# Patient Record
Sex: Female | Born: 1980 | Race: White | Hispanic: Yes | Marital: Married | Smoking: Never smoker
Health system: Southern US, Community
[De-identification: ages and names within clinical notes are randomized; demographics above are authoritative.]

## PROBLEM LIST (undated history)

## (undated) ENCOUNTER — Emergency Department (HOSPITAL_COMMUNITY): Admission: EM | Discharge status: Absent without leave | Payer: Self-pay

## (undated) ENCOUNTER — Inpatient Hospital Stay (HOSPITAL_COMMUNITY): Payer: Self-pay

## (undated) DIAGNOSIS — E119 Type 2 diabetes mellitus without complications: Secondary | ICD-10-CM

## (undated) DIAGNOSIS — Z789 Other specified health status: Secondary | ICD-10-CM

## (undated) HISTORY — PX: APPENDECTOMY: SHX54

---
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
## 2001-02-23 ENCOUNTER — Inpatient Hospital Stay (HOSPITAL_COMMUNITY): Admission: AD | Admit: 2001-02-23 | Discharge: 2001-02-23 | Payer: Self-pay | Admitting: Obstetrics

## 2001-09-11 ENCOUNTER — Inpatient Hospital Stay (HOSPITAL_COMMUNITY): Admission: AD | Admit: 2001-09-11 | Discharge: 2001-09-14 | Payer: Self-pay | Admitting: Obstetrics & Gynecology

## 2001-11-18 ENCOUNTER — Encounter: Admission: RE | Admit: 2001-11-18 | Discharge: 2001-11-18 | Payer: Self-pay | Admitting: Obstetrics & Gynecology

## 2001-12-04 ENCOUNTER — Encounter: Admission: RE | Admit: 2001-12-04 | Discharge: 2001-12-04 | Payer: Self-pay | Admitting: Obstetrics

## 2002-12-08 ENCOUNTER — Encounter: Admission: RE | Admit: 2002-12-08 | Discharge: 2002-12-08 | Payer: Self-pay | Admitting: Family Medicine

## 2003-01-18 ENCOUNTER — Ambulatory Visit (HOSPITAL_COMMUNITY): Admission: RE | Admit: 2003-01-18 | Discharge: 2003-01-18 | Payer: Self-pay | Admitting: *Deleted

## 2003-02-24 ENCOUNTER — Encounter: Admission: RE | Admit: 2003-02-24 | Discharge: 2003-02-24 | Payer: Self-pay | Admitting: *Deleted

## 2003-02-25 ENCOUNTER — Ambulatory Visit (HOSPITAL_COMMUNITY): Admission: RE | Admit: 2003-02-25 | Discharge: 2003-02-25 | Payer: Self-pay | Admitting: *Deleted

## 2003-03-10 ENCOUNTER — Encounter: Admission: RE | Admit: 2003-03-10 | Discharge: 2003-03-10 | Payer: Self-pay | Admitting: *Deleted

## 2003-03-31 ENCOUNTER — Encounter: Admission: RE | Admit: 2003-03-31 | Discharge: 2003-03-31 | Payer: Self-pay | Admitting: *Deleted

## 2003-04-14 ENCOUNTER — Encounter: Admission: RE | Admit: 2003-04-14 | Discharge: 2003-04-14 | Payer: Self-pay | Admitting: *Deleted

## 2003-05-17 ENCOUNTER — Inpatient Hospital Stay (HOSPITAL_COMMUNITY): Admission: AD | Admit: 2003-05-17 | Discharge: 2003-05-17 | Payer: Self-pay | Admitting: Family Medicine

## 2003-05-20 ENCOUNTER — Encounter: Admission: RE | Admit: 2003-05-20 | Discharge: 2003-05-20 | Payer: Self-pay | Admitting: *Deleted

## 2003-05-26 ENCOUNTER — Ambulatory Visit (HOSPITAL_COMMUNITY): Admission: RE | Admit: 2003-05-26 | Discharge: 2003-05-26 | Payer: Self-pay | Admitting: *Deleted

## 2003-06-18 ENCOUNTER — Inpatient Hospital Stay (HOSPITAL_COMMUNITY): Admission: AD | Admit: 2003-06-18 | Discharge: 2003-06-18 | Payer: Self-pay | Admitting: Obstetrics & Gynecology

## 2003-07-07 ENCOUNTER — Encounter: Admission: RE | Admit: 2003-07-07 | Discharge: 2003-07-07 | Payer: Self-pay | Admitting: *Deleted

## 2003-07-07 ENCOUNTER — Inpatient Hospital Stay (HOSPITAL_COMMUNITY): Admission: AD | Admit: 2003-07-07 | Discharge: 2003-07-09 | Payer: Self-pay | Admitting: Obstetrics and Gynecology

## 2006-09-15 ENCOUNTER — Emergency Department (HOSPITAL_COMMUNITY): Admission: EM | Admit: 2006-09-15 | Discharge: 2006-09-15 | Payer: Self-pay | Admitting: Emergency Medicine

## 2007-06-05 ENCOUNTER — Ambulatory Visit: Payer: Self-pay | Admitting: Family Medicine

## 2007-07-17 ENCOUNTER — Ambulatory Visit (HOSPITAL_BASED_OUTPATIENT_CLINIC_OR_DEPARTMENT_OTHER): Admission: RE | Admit: 2007-07-17 | Discharge: 2007-07-17 | Payer: Self-pay | Admitting: Urology

## 2009-08-05 ENCOUNTER — Emergency Department (HOSPITAL_COMMUNITY): Admission: EM | Admit: 2009-08-05 | Discharge: 2009-08-05 | Payer: Self-pay | Admitting: Emergency Medicine

## 2010-03-02 ENCOUNTER — Inpatient Hospital Stay (HOSPITAL_COMMUNITY): Admission: AD | Admit: 2010-03-02 | Discharge: 2010-03-02 | Payer: Self-pay | Admitting: Family Medicine

## 2010-05-03 ENCOUNTER — Emergency Department (HOSPITAL_COMMUNITY): Admission: EM | Admit: 2010-05-03 | Discharge: 2010-05-03 | Payer: Self-pay | Admitting: Emergency Medicine

## 2010-05-24 ENCOUNTER — Encounter: Payer: Self-pay | Admitting: Obstetrics and Gynecology

## 2010-05-24 ENCOUNTER — Ambulatory Visit: Payer: Self-pay | Admitting: Obstetrics and Gynecology

## 2010-05-24 LAB — CONVERTED CEMR LAB
Antibody Screen: NEGATIVE
Basophils Relative: 0 % (ref 0–1)
Eosinophils Absolute: 0.1 10*3/uL (ref 0.0–0.7)
Hemoglobin: 11.1 g/dL — ABNORMAL LOW (ref 12.0–15.0)
Hepatitis B Surface Ag: NEGATIVE
Hgb F Quant: 0 % (ref 0.0–2.0)
Lymphs Abs: 1.3 10*3/uL (ref 0.7–4.0)
MCHC: 31.9 g/dL (ref 30.0–36.0)
Monocytes Absolute: 0.3 10*3/uL (ref 0.1–1.0)
Neutrophils Relative %: 78 % — ABNORMAL HIGH (ref 43–77)
RBC: 4.11 M/uL (ref 3.87–5.11)
Rh Type: POSITIVE
Rubella: 387.5 intl units/mL — ABNORMAL HIGH

## 2010-06-01 ENCOUNTER — Ambulatory Visit (HOSPITAL_COMMUNITY): Admission: RE | Admit: 2010-06-01 | Discharge: 2010-06-01 | Payer: Self-pay | Admitting: Obstetrics and Gynecology

## 2010-06-26 ENCOUNTER — Ambulatory Visit: Payer: Self-pay | Admitting: Obstetrics & Gynecology

## 2010-06-27 ENCOUNTER — Encounter: Payer: Self-pay | Admitting: Obstetrics and Gynecology

## 2010-07-13 ENCOUNTER — Inpatient Hospital Stay (HOSPITAL_COMMUNITY): Admission: AD | Admit: 2010-07-13 | Discharge: 2010-07-13 | Payer: Self-pay | Admitting: Obstetrics and Gynecology

## 2010-07-17 ENCOUNTER — Ambulatory Visit: Payer: Self-pay | Admitting: Obstetrics and Gynecology

## 2010-07-17 ENCOUNTER — Encounter: Payer: Self-pay | Admitting: Obstetrics & Gynecology

## 2010-07-17 LAB — CONVERTED CEMR LAB
MCHC: 32.2 g/dL (ref 30.0–36.0)
Platelets: 223 10*3/uL (ref 150–400)
RBC: 4.22 M/uL (ref 3.87–5.11)
RDW: 14.4 % (ref 11.5–15.5)

## 2010-07-18 ENCOUNTER — Encounter: Payer: Self-pay | Admitting: Family Medicine

## 2010-08-10 ENCOUNTER — Encounter: Payer: Self-pay | Admitting: Obstetrics & Gynecology

## 2010-08-10 ENCOUNTER — Ambulatory Visit: Payer: Self-pay | Admitting: Obstetrics & Gynecology

## 2010-08-31 ENCOUNTER — Ambulatory Visit: Payer: Self-pay | Admitting: Obstetrics & Gynecology

## 2010-09-07 ENCOUNTER — Ambulatory Visit: Payer: Self-pay | Admitting: Obstetrics & Gynecology

## 2010-09-07 LAB — CONVERTED CEMR LAB: GC Probe Amp, Urine: NEGATIVE

## 2010-09-20 ENCOUNTER — Ambulatory Visit: Payer: Self-pay | Admitting: Obstetrics and Gynecology

## 2010-09-20 ENCOUNTER — Inpatient Hospital Stay (HOSPITAL_COMMUNITY): Admission: AD | Admit: 2010-09-20 | Discharge: 2010-09-20 | Payer: Self-pay | Admitting: Obstetrics & Gynecology

## 2010-09-21 ENCOUNTER — Ambulatory Visit: Payer: Self-pay | Admitting: Obstetrics & Gynecology

## 2010-09-21 ENCOUNTER — Inpatient Hospital Stay: Admission: AD | Admit: 2010-09-21 | Discharge: 2010-09-21 | Payer: Self-pay | Admitting: Obstetrics and Gynecology

## 2010-09-22 ENCOUNTER — Inpatient Hospital Stay (HOSPITAL_COMMUNITY)
Admission: AD | Admit: 2010-09-22 | Discharge: 2010-09-22 | Payer: Self-pay | Source: Home / Self Care | Admitting: Obstetrics and Gynecology

## 2010-09-23 ENCOUNTER — Inpatient Hospital Stay (HOSPITAL_COMMUNITY): Admission: AD | Admit: 2010-09-23 | Discharge: 2010-09-25 | Payer: Self-pay | Admitting: Obstetrics and Gynecology

## 2010-09-23 ENCOUNTER — Ambulatory Visit: Payer: Self-pay | Admitting: Family Medicine

## 2011-02-14 LAB — POCT URINALYSIS DIPSTICK
Bilirubin Urine: NEGATIVE
Bilirubin Urine: NEGATIVE
Glucose, UA: 100 mg/dL — AB
Hgb urine dipstick: NEGATIVE
Hgb urine dipstick: NEGATIVE
Ketones, ur: NEGATIVE mg/dL
Ketones, ur: NEGATIVE mg/dL
Nitrite: NEGATIVE
Nitrite: NEGATIVE
Protein, ur: NEGATIVE mg/dL
Specific Gravity, Urine: 1.02 (ref 1.005–1.030)
Urobilinogen, UA: 0.2 mg/dL (ref 0.0–1.0)

## 2011-02-14 LAB — CBC
HCT: 37.2 % (ref 36.0–46.0)
MCH: 27.6 pg (ref 26.0–34.0)
Platelets: 177 10*3/uL (ref 150–400)
RBC: 4.52 MIL/uL (ref 3.87–5.11)

## 2011-02-14 LAB — RPR: RPR Ser Ql: NONREACTIVE

## 2011-02-15 LAB — POCT URINALYSIS DIPSTICK
Bilirubin Urine: NEGATIVE
Bilirubin Urine: NEGATIVE
Glucose, UA: NEGATIVE mg/dL
Ketones, ur: NEGATIVE mg/dL
Ketones, ur: NEGATIVE mg/dL
Nitrite: NEGATIVE
Nitrite: NEGATIVE
Nitrite: NEGATIVE
Protein, ur: NEGATIVE mg/dL
Protein, ur: NEGATIVE mg/dL
Protein, ur: 30 mg/dL — AB
Specific Gravity, Urine: >=1.03 (ref 1.005–1.030)
Urobilinogen, UA: 0.2 mg/dL (ref 0.0–1.0)
pH: 5.5 (ref 5.0–8.0)

## 2011-02-16 LAB — URINALYSIS, ROUTINE W REFLEX MICROSCOPIC
Bilirubin Urine: NEGATIVE
Protein, ur: NEGATIVE mg/dL
Specific Gravity, Urine: 1.02 (ref 1.005–1.030)
Urobilinogen, UA: 0.2 mg/dL (ref 0.0–1.0)
pH: 7 (ref 5.0–8.0)

## 2011-02-16 LAB — WET PREP, GENITAL
Clue Cells Wet Prep HPF POC: NONE SEEN
Trich, Wet Prep: NONE SEEN
Yeast Wet Prep HPF POC: NONE SEEN

## 2011-02-17 LAB — POCT URINALYSIS DIP (DEVICE)
Bilirubin Urine: NEGATIVE
Hgb urine dipstick: NEGATIVE
Ketones, ur: NEGATIVE mg/dL
pH: 5.5 (ref 5.0–8.0)

## 2011-02-18 LAB — POCT URINALYSIS DIP (DEVICE)
Hgb urine dipstick: NEGATIVE
Ketones, ur: NEGATIVE mg/dL
Protein, ur: NEGATIVE mg/dL
Specific Gravity, Urine: 1.02 (ref 1.005–1.030)
Urobilinogen, UA: 0.2 mg/dL (ref 0.0–1.0)

## 2011-02-19 LAB — URINALYSIS, ROUTINE W REFLEX MICROSCOPIC
Bilirubin Urine: NEGATIVE
Hgb urine dipstick: NEGATIVE
Ketones, ur: >80 mg/dL — AB
pH: 8.5 — ABNORMAL HIGH (ref 5.0–8.0)

## 2011-02-19 LAB — DIFFERENTIAL
Basophils Absolute: 0 10*3/uL (ref 0.0–0.1)
Basophils Relative: 0 % (ref 0–1)
Eosinophils Relative: 0 % (ref 0–5)
Lymphocytes Relative: 8 % — ABNORMAL LOW (ref 12–46)
Lymphs Abs: 0.8 10*3/uL (ref 0.7–4.0)
Monocytes Relative: 6 % (ref 3–12)

## 2011-02-19 LAB — BASIC METABOLIC PANEL
BUN: 5 mg/dL — ABNORMAL LOW (ref 6–23)
Calcium: 8.6 mg/dL (ref 8.4–10.5)
Creatinine, Ser: 0.41 mg/dL (ref 0.4–1.2)
Potassium: 3.4 mEq/L — ABNORMAL LOW (ref 3.5–5.1)
Sodium: 134 mEq/L — ABNORMAL LOW (ref 135–145)

## 2011-02-19 LAB — CBC
HCT: 33.3 % — ABNORMAL LOW (ref 36.0–46.0)
Hemoglobin: 11.3 g/dL — ABNORMAL LOW (ref 12.0–15.0)
Platelets: 175 10*3/uL (ref 150–400)
WBC: 10.2 10*3/uL (ref 4.0–10.5)

## 2011-02-19 LAB — POCT CARDIAC MARKERS: CKMB, poc: <1 ng/mL — ABNORMAL LOW (ref 1.0–8.0)

## 2011-02-25 LAB — URINALYSIS, ROUTINE W REFLEX MICROSCOPIC
Ketones, ur: NEGATIVE mg/dL
Nitrite: NEGATIVE
Protein, ur: NEGATIVE mg/dL
pH: 7 (ref 5.0–8.0)

## 2011-02-25 LAB — URINE CULTURE

## 2011-02-25 LAB — CBC
HCT: 34.5 % — ABNORMAL LOW (ref 36.0–46.0)
MCV: 84.8 fL (ref 78.0–100.0)
Platelets: 190 10*3/uL (ref 150–400)
RDW: 13.4 % (ref 11.5–15.5)

## 2011-02-25 LAB — COMPREHENSIVE METABOLIC PANEL
Albumin: 3.5 g/dL (ref 3.5–5.2)
BUN: 8 mg/dL (ref 6–23)
Creatinine, Ser: 0.48 mg/dL (ref 0.4–1.2)
Total Bilirubin: 0.4 mg/dL (ref 0.3–1.2)
Total Protein: 6.3 g/dL (ref 6.0–8.3)

## 2011-02-25 LAB — URINE MICROSCOPIC-ADD ON

## 2011-02-25 LAB — HCG, QUANTITATIVE, PREGNANCY: hCG, Beta Chain, Quant, S: 89672 m[IU]/mL — ABNORMAL HIGH (ref <5)

## 2011-02-25 LAB — POCT PREGNANCY, URINE: Preg Test, Ur: POSITIVE

## 2011-02-25 LAB — WET PREP, GENITAL: Yeast Wet Prep HPF POC: NONE SEEN

## 2011-02-25 LAB — GC/CHLAMYDIA PROBE AMP, GENITAL
Chlamydia, DNA Probe: NEGATIVE
GC Probe Amp, Genital: NEGATIVE

## 2011-04-17 NOTE — Group Therapy Note (Signed)
NAME:  Andrea Wise, FLIGHT NO.:  [id number]   MEDICAL RECORD NO.:  [id number]          PATIENT TYPE:  WOC   LOCATION:  [facility]                   FACILITY:  [facility]   PHYSICIAN:  Tinnie Gens, MD        DATE OF BIRTH:  07/29/1980   DATE OF SERVICE:  06/05/2007                                  CLINIC NOTE   CHIEF COMPLAINT:  Dyspareunia.   HISTORY OF PRESENT ILLNESS:  The patient is a 30 year old, gravida 5,  para 2-0-1-2, who has two previous vaginal deliveries, the last delivery  date was July 07, 2003, who does not use anything for birth control  except natural family planning.  The [facility] for a progressive dyspareunia over the last approximately one  year.  She states that her vagina hurts and it is very painful.  She  first had intercourse at age 32, she has no history of sexually  transmitted diseases at all.  She has no history of sexual abuse of any  sort, she was never raped.  The patient was seen at the [facility] in May of this year and continued to have painful  intercourse, was treated with doxycycline prophylactically but had  negative GC and Chlamydia cultures, and because she had no response to  the doxycycline she was sent here for further workup and evaluation.   PAST MEDICAL HISTORY:  Negative.   PAST SURGICAL HISTORY:  Negative.   MEDICATIONS:  None.   ALLERGIES:  None known.   OBSTETRIC HISTORY:  She is G3, P2, two vaginal deliveries, one  spontaneous AB.   GYNECOLOGIC HISTORY:  Menarche at age 52, cycles every 28 days, no pain  with her periods and not heavy.  No history of abnormal Pap, the last  Pap was Apr 16, 2007, and was normal.   FAMILY HISTORY:  Diabetes.   SOCIAL HISTORY:  The patient does work, she denies tobacco, alcohol, or  drug use.   REVIEW OF SYSTEMS:  A 14-point review of systems was reviewed and is  negative except as described in the HPI.   PHYSICAL EXAMINATION:   VITAL SIGNS:  Her blood pressure is 100/60,  weight 123, temp 98, pulse 72.  GENERAL:  She is a well-developed, well-nourished, Hispanic female in no  acute distress.  ABDOMEN:  Soft, nontender, and nondistended.  GU:  Normal external female genitalia.  The vagina is pink with  rugation.  BUS is normal.  The cervix is parous without lesion.  The  uterus is small and anteverted, but the pelvis is diffusely tender  including the vagina circumferentially at the introitus.  The bladder is  exquisitely tender.  She has cervical motion tenderness.  The vaginal  walls on the side and posteriorly are all exquisitely tender.  The  patient was very tense during the exam and had a lot of difficulty with  relaxation.   IMPRESSION:  Dyspareunia, unclear etiology, doubt endometriosis given no  cyclical pelvic pain.   PLAN:  1. Rule out interstitial cystitis, referral to Urology.  2. Start Elavil 25 mg p.o. 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  3. We will have her back in four to six weeks to see how the      medication is doing as well as to follow up on the Urology referral      and to figure out what further workup or what else needs to be      done.           ______________________________  Tinnie Gens, MD     TP/MEDQ  D:  06/05/2007  T:  06/06/2007  Job:  [id number]

## 2011-04-17 NOTE — Op Note (Signed)
NAME:  Andrea Wise, Andrea Wise    ACCOUNT NO.:  [id number]   MEDICAL RECORD NO.:  [id number]          PATIENT TYPE:  AMB   LOCATION:  [facility]                         FACILITY:  [facility]   PHYSICIAN:  Martina Sinner, MD DATE OF BIRTH:  07/29/1980   DATE OF PROCEDURE:  DATE OF DISCHARGE:                               OPERATIVE REPORT   INDICATIONS:  Ms. Andrea Wise has pelvic pain and a lot of vaginal pain.  She has mild urinary frequency.   PROCEDURE:  Today she was prepped and draped in the usual fashion.  A 21-  [place] scope was used for examination. On initial inspection of the  bladder the bladder mucosa and trigone were normal.  She was  hydrodistended to 650 mL.  On reinspection of the bladder she had  diffuse glomerulations with bleeding.  She did not have any ulcers.   The bladder was emptied.  Then I instilled 15 mL of 0.5% Marcaine with  400 milligrams of Pyridium  through a small red rubber catheter.  In my  opinion, this patient has interstitial cystitis.  She will be treated as  per protocol.  Cost will be taken into consideration when treating her.           ______________________________  Martina Sinner, MD  Electronically Signed     SAM/MEDQ  D:  07/17/2007  T:  07/18/2007  Job:  [id number]

## 2011-08-12 IMAGING — US US OB COMP LESS 14 WK
1 series · 14 of 21 positions shown · non-contrast
Comparison: None.

CLINICAL DATA: Pelvic pain.  Pregnant with a quantitative beta HCG
of [DATE].  Unknown last menstrual period.

OBSTETRIC <14 WK US
TECHNIQUE: Transabdominal ultrasound was performed for complete
evaluation of the gestation as well as the maternal uterus, adnexal
regions, and pelvic cul-de-sac.

[Series 1: us ob comp less 14 wks · 0.24mm/px · 14 of 21 slices shown]
[im 1/21]
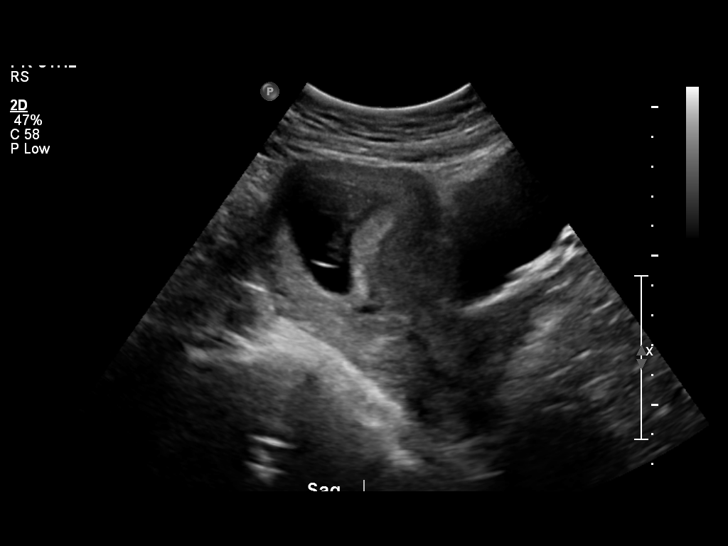
[im 3/21]
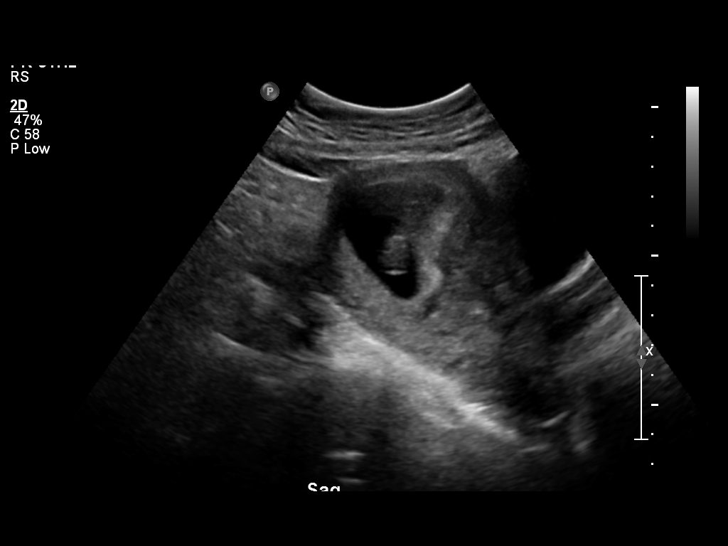
[im 4/21]
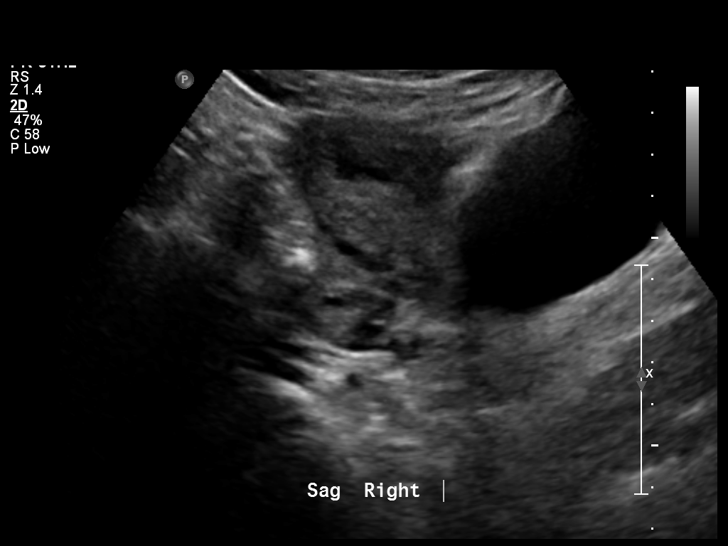
[im 6/21]
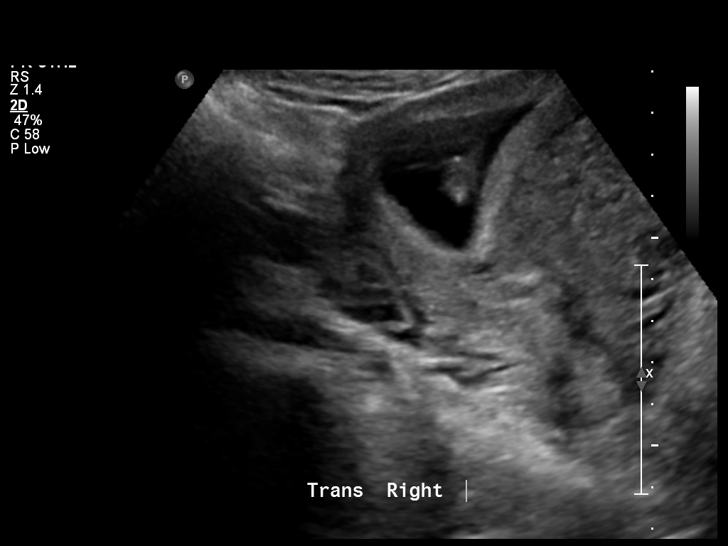
[im 7/21]
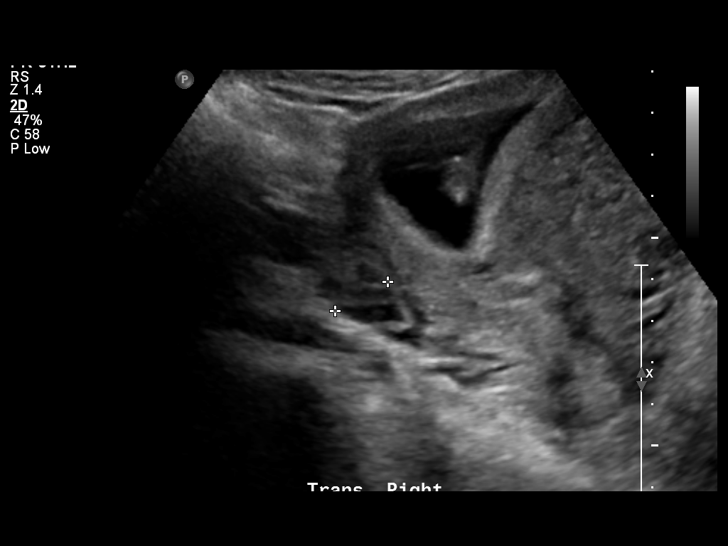
[im 9/21]
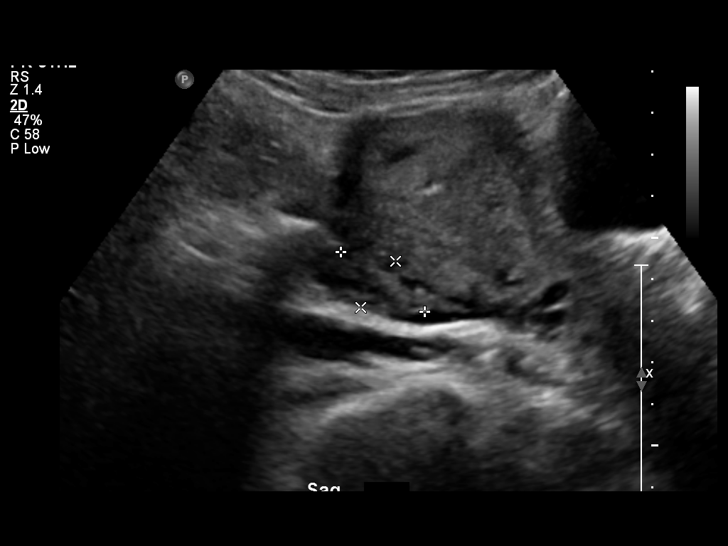
[im 10/21]
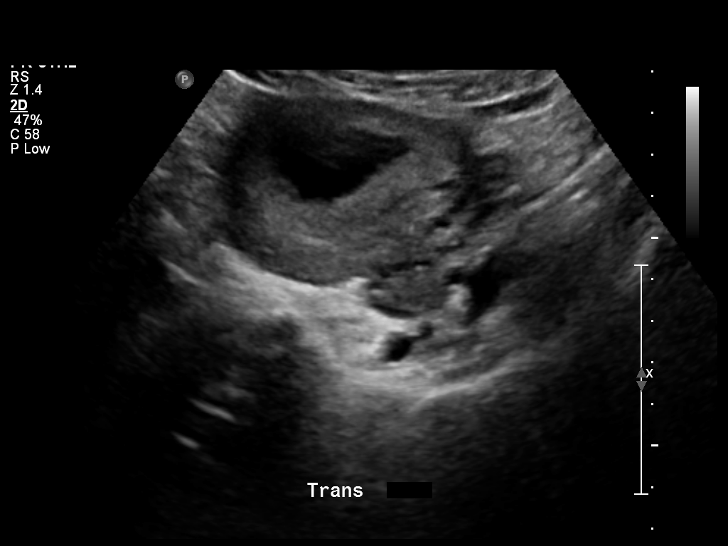
[im 12/21]
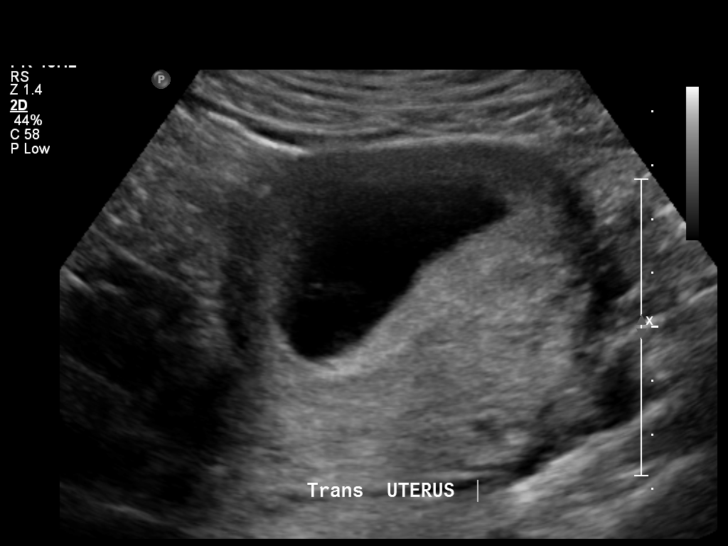
[im 13/21]
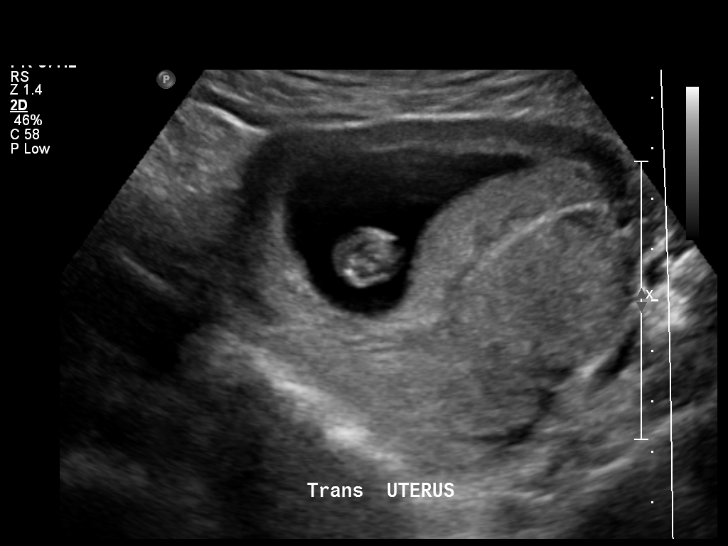
[im 15/21]
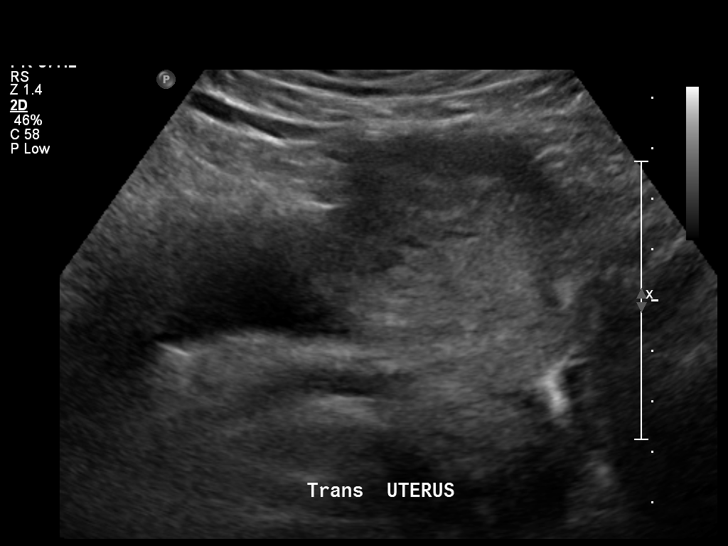
[im 16/21]
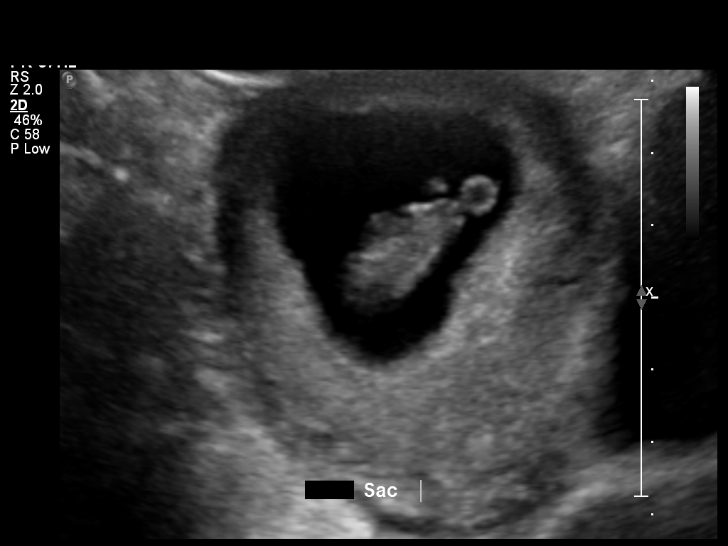
[im 18/21]
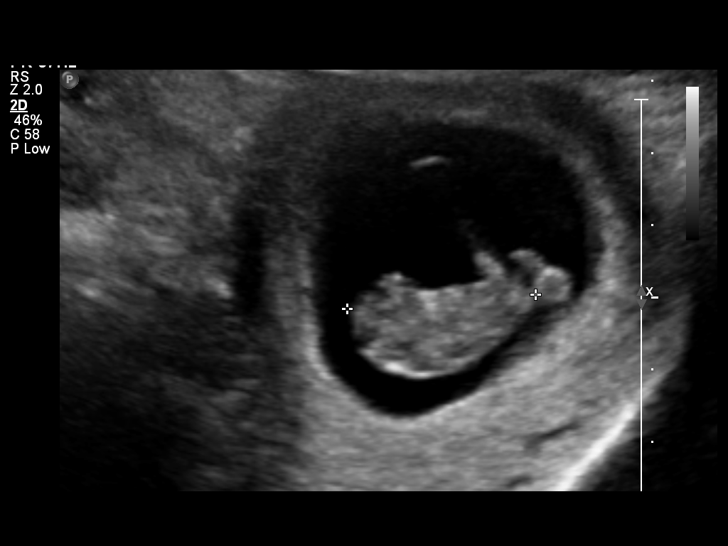
[im 19/21]
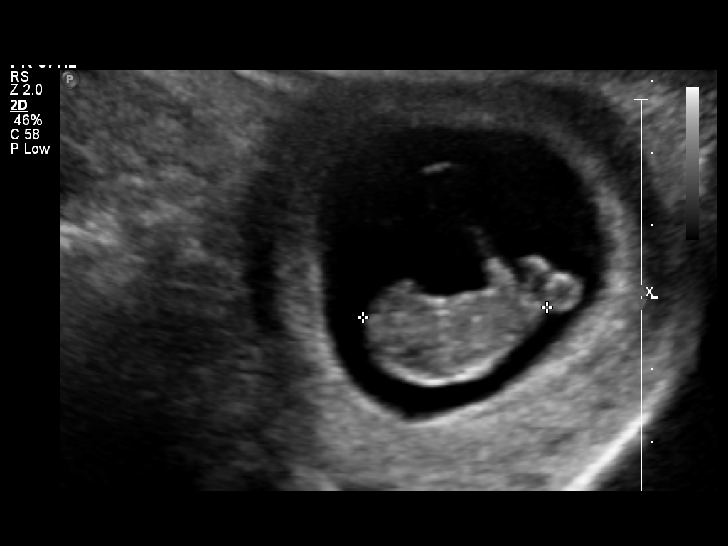
[im 21/21]
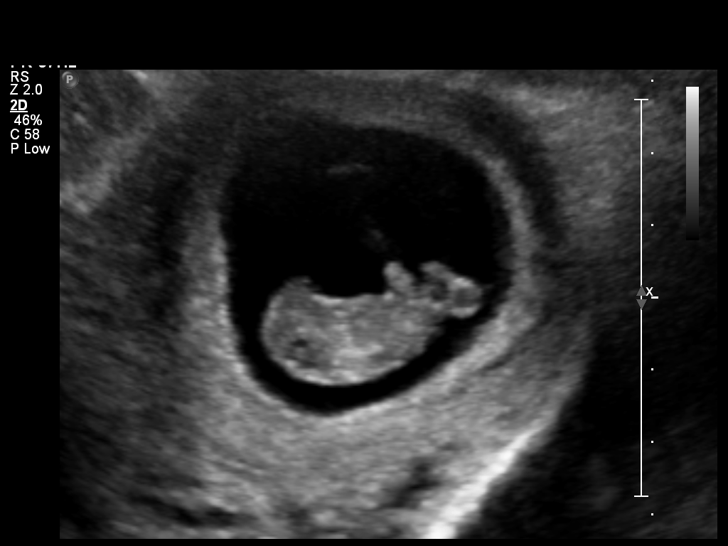

[14 of 21 positions shown; findings below may reference images not displayed]

FINDINGS: Intrauterine gestational sac containing a fetal pole
with a mean fetal crown-rump length of 2.54 cm.  This gives an
estimated gestational age of 9 weeks and 3 days.  This gives an
estimated date of delivery of 10/02/2010.  Normal fetal cardiac
activity with a fetal heart rate of 183 beats per minute.  An
adjacent normal appearing yolk sac is noted.  No subchorionic
hemorrhage seen.  Normal appearing maternal ovaries.  No free
peritoneal fluid.
IMPRESSION: Single live intrauterine gestation with an estimated gestational
age of 9 weeks and 3 days.

## 2011-09-17 LAB — POCT HEMOGLOBIN-HEMACUE: Hemoglobin: 13.9

## 2011-10-13 IMAGING — CR DG CHEST 2V
2 series · 2 of 2 positions shown · non-contrast
Comparison: None.

CLINICAL DATA: Chest pain.  Shortness of breath.

CHEST - 2 VIEW

[w chest pa]
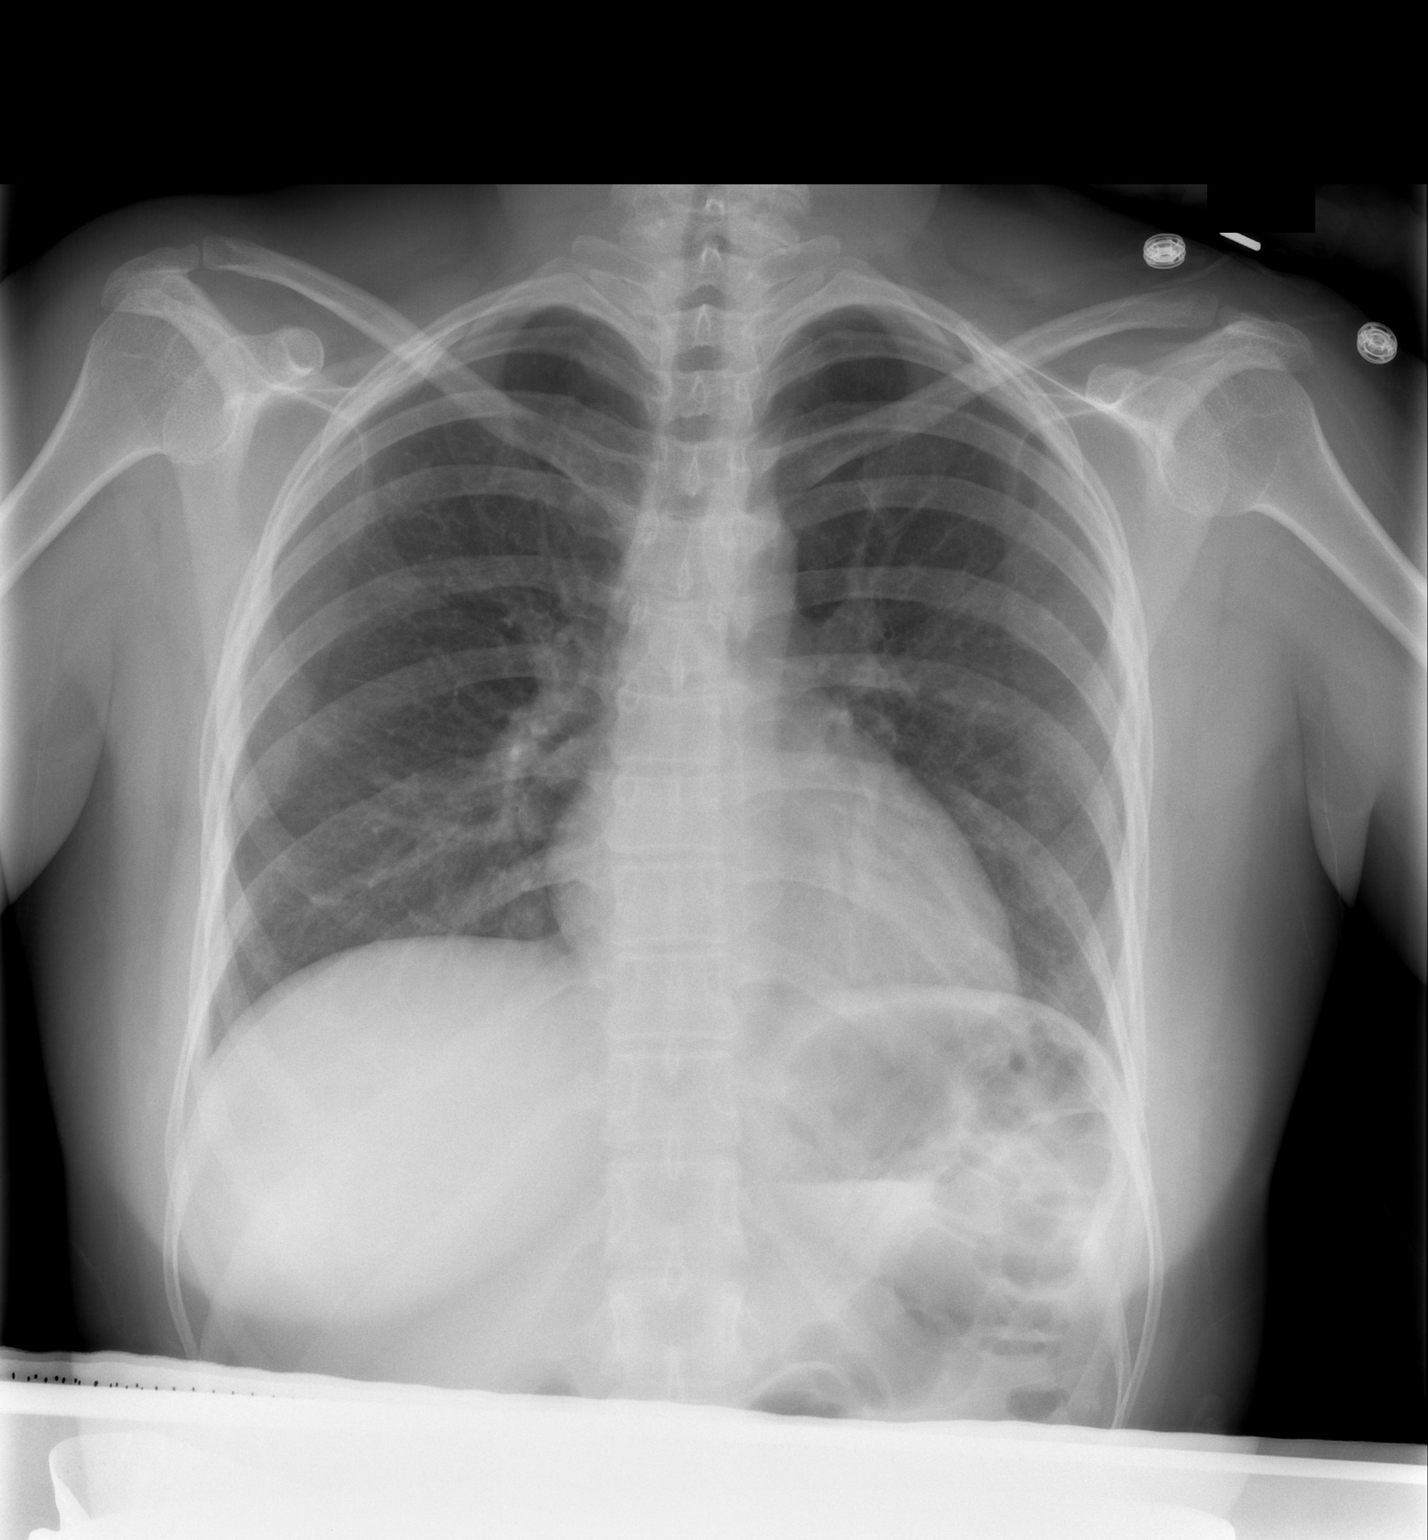

[w chest lat]
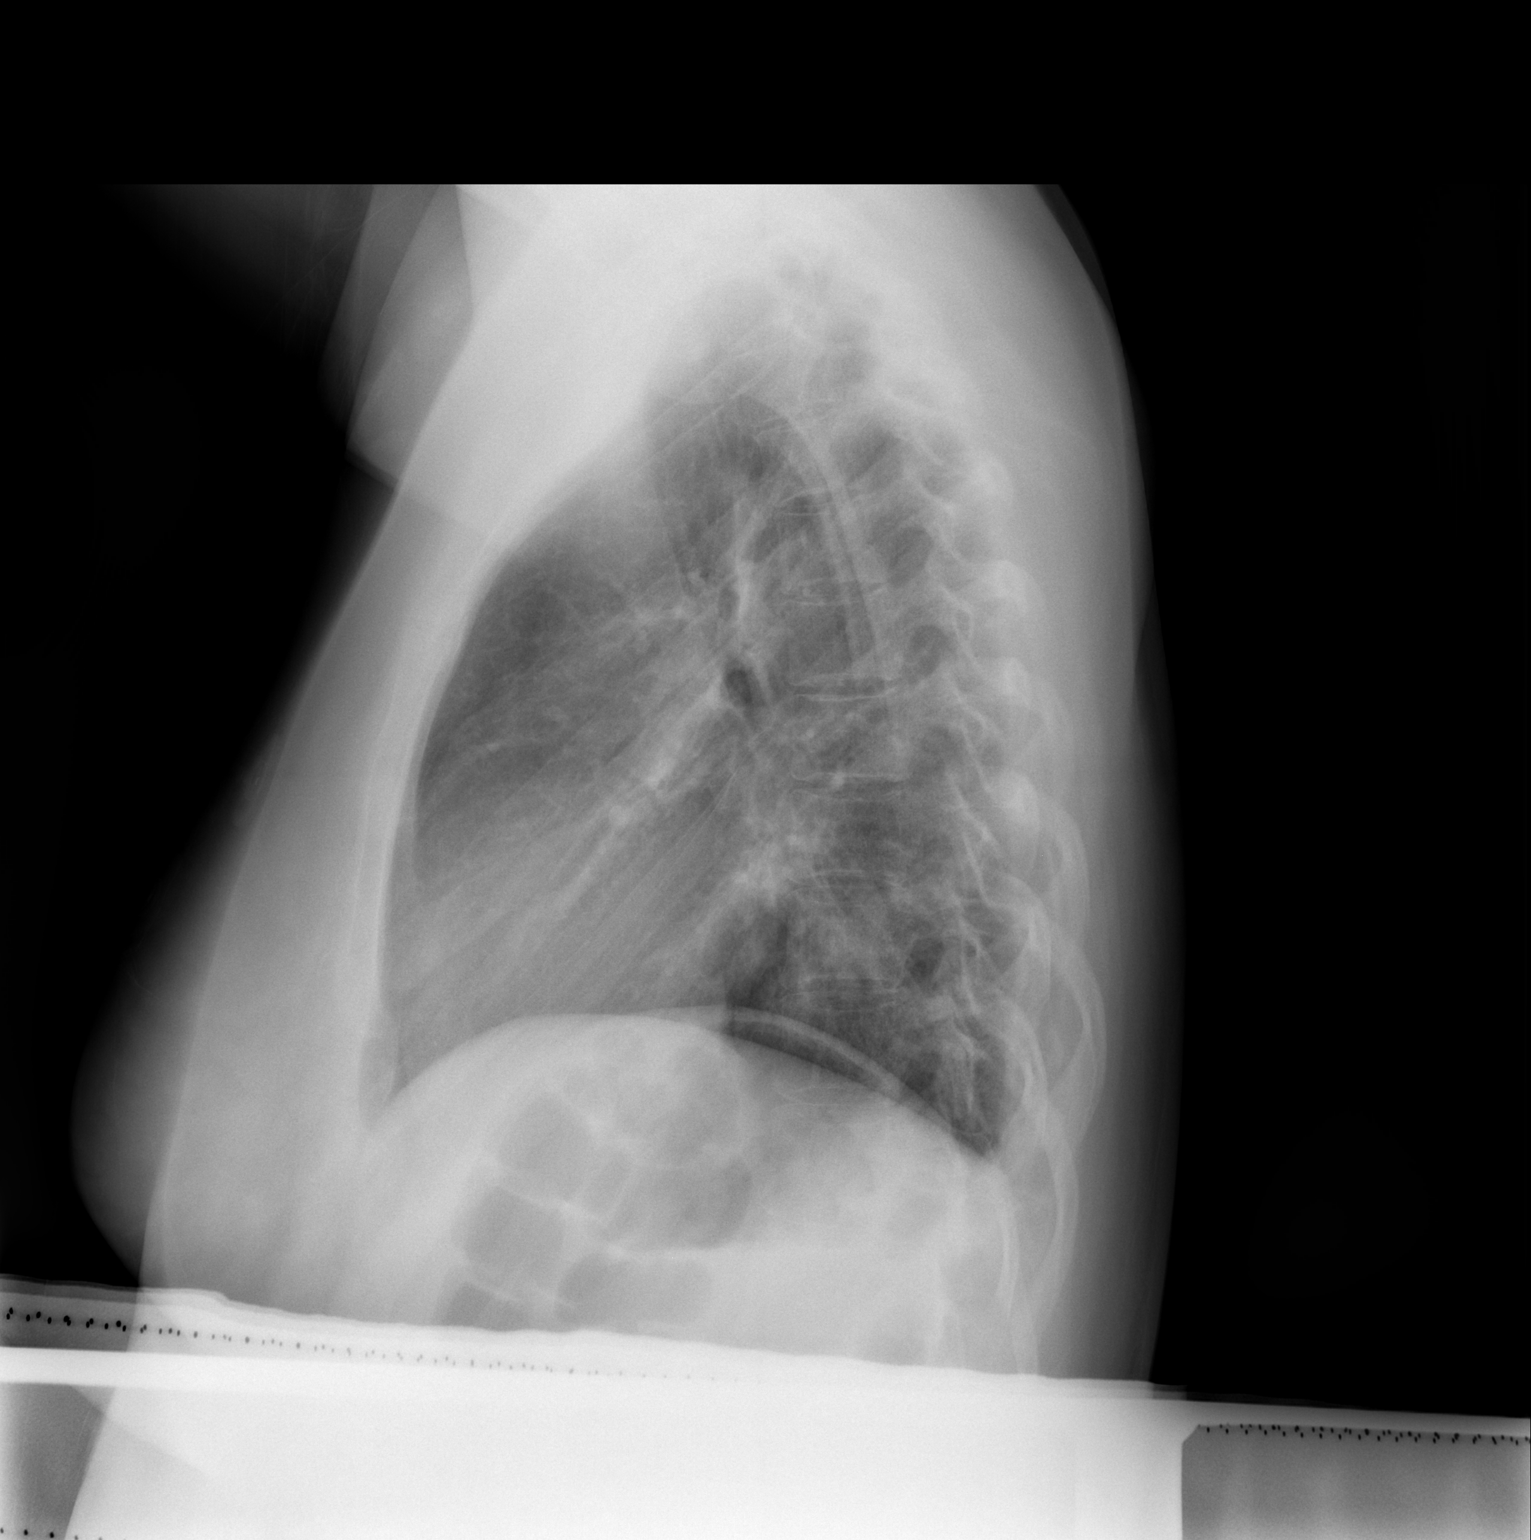

[2 of 2 positions shown; findings below may reference images not displayed]

FINDINGS: Lungs are clear.  Heart size is normal.  No pleural
effusion or focal bony abnormality.
IMPRESSION: Negative chest.

## 2011-12-04 NOTE — L&D Delivery Note (Signed)
I have seen and examined this patient and I agree with the above. Copper Kirtley 10:14 PM 08/19/2012

## 2011-12-04 NOTE — L&D Delivery Note (Signed)
Delivery Note At 3:57 AM a viable and healthy female was delivered via Vaginal, Spontaneous Delivery (Presentation: ;  ).  APGAR: 9, 9; weight 8 lb 1.5 oz (3670 g).   Placenta status: Intact, Spontaneous.  Cord: 3 vessels with the following complications: 1 loose nuchal cord, reduced during delivery .  Cord pH: not drawn  Anesthesia:  IV nubain Episiotomy: None Lacerations: small 1st degree perineal lac, not repaired (hemostatic) Suture Repair: n/a Est. Blood Loss (mL): (pitocin and 800mg  cytotec used)  Mom to postpartum.  Baby to nursery-stable.  Rudell Marlowe 08/16/2012, 4:21 AM

## 2012-04-15 ENCOUNTER — Inpatient Hospital Stay (HOSPITAL_COMMUNITY)
Admission: AD | Admit: 2012-04-15 | Discharge: 2012-04-15 | Disposition: A | Discharge status: Home / Self Care | Payer: Self-pay | Source: Ambulatory Visit | Attending: Obstetrics & Gynecology | Admitting: Obstetrics & Gynecology

## 2012-04-15 ENCOUNTER — Encounter (HOSPITAL_COMMUNITY): Payer: Self-pay | Admitting: *Deleted

## 2012-04-15 DIAGNOSIS — O99891 Other specified diseases and conditions complicating pregnancy: Secondary | ICD-10-CM | POA: Insufficient documentation

## 2012-04-15 DIAGNOSIS — N949 Unspecified condition associated with female genital organs and menstrual cycle: Secondary | ICD-10-CM

## 2012-04-15 DIAGNOSIS — R109 Unspecified abdominal pain: Secondary | ICD-10-CM | POA: Insufficient documentation

## 2012-04-15 LAB — URINE MICROSCOPIC-ADD ON

## 2012-04-15 LAB — URINALYSIS, ROUTINE W REFLEX MICROSCOPIC
Bilirubin Urine: NEGATIVE
Nitrite: NEGATIVE
Specific Gravity, Urine: >1.03 — ABNORMAL HIGH (ref 1.005–1.030)
pH: 5.5 (ref 5.0–8.0)

## 2012-04-15 LAB — WET PREP, GENITAL
Trich, Wet Prep: NONE SEEN
Yeast Wet Prep HPF POC: NONE SEEN

## 2012-04-15 LAB — POCT PREGNANCY, URINE: Preg Test, Ur: POSITIVE — AB

## 2012-04-15 NOTE — MAU Note (Signed)
Pt reports "contractions", unsure of LMP because she had a baby last year and has not had a period since then. Positive preg test at [facility].

## 2012-04-15 NOTE — MAU Provider Note (Signed)
Chief Complaint:  Abdominal Pain    First Provider Initiated Contact with Patient 04/15/12 2047      Andrea Wise is  31 y.o. [id number].  No LMP recorded. Patient is pregnant..  Her pregnancy status is positive.  She presents complaining of Abdominal Pain . Onset is described as intermittent and has been present for  1-2  days. Described intermittent sharp pain, worse with movement, resolved with rest. Lasts less than 10 seconds. Denies cramping, vaginal discharge, bleeding. Reports + FM.  Obstetrical/Gynecological History: OB History    Grav Para Term Preterm Abortions TAB SAB Ect Mult Living   5 3 2 1 1  1   3       Past Medical History: History reviewed. No pertinent past medical history.  Past Surgical History: Past Surgical History  Procedure Date  . Appendectomy     Family History: History reviewed. No pertinent family history.  Social History: History  Substance Use Topics  . Smoking status: Never Smoker   . Smokeless tobacco: Not on file  . Alcohol Use: No    Allergies: No Known Allergies  Prescriptions prior to admission  Medication Sig Dispense Refill  . Prenatal Vit-Fe Fumarate-FA (PRENATAL MULTIVITAMIN) TABS Take 1 tablet by mouth at bedtime.        Review of Systems - History obtained from the patient Respiratory ROS: no cough, shortness of breath, or wheezing Cardiovascular ROS: no chest pain or dyspnea on exertion Gastrointestinal ROS: positive for - abdominal pain negative for - constipation, diarrhea, gas/bloating or nausea/vomiting Genito-Urinary ROS: no dysuria, trouble voiding, or hematuria negative for - vulvar/vaginal symptoms  Physical Exam   Blood pressure 116/55, pulse 86, temperature 97 F (36.1 C), temperature source Oral, resp. rate 18, height 5' (1.524 m), weight 159 lb (72.122 kg), SpO2 98.00%.  General: General appearance - alert, well appearing, and in no distress and oriented to person, place, and time Mental  status - alert, oriented to person, place, and time, normal mood, behavior, speech, dress, motor activity, and thought processes, affect appropriate to mood Eyes - left eye normal, right eye normal Abdomen - gravid, non tender, fundus at umbilicus Extremities - peripheral pulses normal, no pedal edema, no clubbing or cyanosis Focused Gynecological Exam: normal external genitalia, vulva, vagina, cervix, uterus and adnexa, cervix: closed  Labs: Recent Results (from the past 24 hour(s))  URINALYSIS, ROUTINE W REFLEX MICROSCOPIC   Collection Time   04/15/12  8:20 PM      Component Value Range   Color, Urine YELLOW  YELLOW    APPearance CLEAR  CLEAR    Specific Gravity, Urine >1.030 (*) 1.005 - 1.030    pH 5.5  5.0 - 8.0    Glucose, UA NEGATIVE  NEGATIVE (mg/dL)   Hgb urine dipstick NEGATIVE  NEGATIVE    Bilirubin Urine NEGATIVE  NEGATIVE    Ketones, ur NEGATIVE  NEGATIVE (mg/dL)   Protein, ur NEGATIVE  NEGATIVE (mg/dL)   Urobilinogen, UA 0.2  0.0 - 1.0 (mg/dL)   Nitrite NEGATIVE  NEGATIVE    Leukocytes, UA SMALL (*) NEGATIVE   URINE MICROSCOPIC-ADD ON   Collection Time   04/15/12  8:20 PM      Component Value Range   Squamous Epithelial / LPF FEW (*) RARE    WBC, UA 7-10  <3 (WBC/hpf)   RBC / HPF 0-2  <3 (RBC/hpf)   Bacteria, UA FEW (*) RARE    Urine-Other RARE YEAST    POCT PREGNANCY, URINE  Collection Time   04/15/12  8:30 PM      Component Value Range   Preg Test, Ur POSITIVE (*) NEGATIVE   WET PREP, GENITAL   Collection Time   04/15/12  8:50 PM      Component Value Range   Yeast Wet Prep HPF POC NONE SEEN  NONE SEEN    Trich, Wet Prep NONE SEEN  NONE SEEN    Clue Cells Wet Prep HPF POC NONE SEEN  NONE SEEN    WBC, Wet Prep HPF POC MANY (*) NONE SEEN    Imaging Studies:  Informal bedside US by provider. HC c/w [redacted]w[redacted]d. Sub NL fluid. + FM and cardiac activity.    Assessment: Round ligament pain  Plan: Discharge home FU as scheduled with [facility] will call to  schedule OB Complete US for anatomy  Dennard Vezina E. 04/15/2012,9:44 PM

## 2012-04-15 NOTE — MAU Provider Note (Signed)
Attestation of Attending Supervision of Advanced Practitioner: Evaluation and management procedures were performed by the [facility] Fellow/PA/CNM/NP under my supervision and collaboration. Chart reviewed, and agree with management and plan.  Jaynie Collins, M.D. 04/15/2012 10:37 PM

## 2012-04-15 NOTE — Discharge Instructions (Signed)
Dolor del ligamento redondo (Round Ligament Pain) El ligamento redondo se compone de msculo y tejido fibroso. Est unido al tero cerca de las trompas de [place] El ligamento redondo est ubicado en ambos lados del tero y ayuda a mantener su posicin. Normalmente comienza en el segundo trimestre del embarazo cuando el tero sale hacia afuera de la pelvis. El dolor puede aparecer y desaparecer hasta el nacimiento del beb. El dolor de ligamento redondo no es un problema serio y no ocasiona daos al beb. CAUSAS Durante el embarazo el tero crece mayormente desde el segundo trimestre hasta el parto. A medida que crece, se estira y tuerce ligeramente los ligamentos. Cuando el tero ejerce presin en ambos lados, el ligamento redondo del lado opuesto presiona y se estira. Esto causa dolor. SNTOMAS El dolor puede ocurrir en uno o ambos lados. El dolor es por lo general como un pellizco corto y filoso. A veces puede ser un dolor opaco y persistente. Se siente en la parte baja del abdomen o en la ingle. Es un dolor interno, y por lo general comienza en la ingle y se mueve hacia la zona de la cadera. El dolor puede ocurrir con:  Cambio de posicin repentino como el levantarse de la cama o de una silla.   Darse vuelta en la cama.   Toser o estornudar.   Caminar demasiado.   Cualquier tipo de actividad fsica.  DIAGNSTICO El medico deber asegurarse de que no existen problemas graves que ocasionan el dolor. Si no encuentra nada grave, los sntomas suelen indicar de que se trata de un dolor proveniente del ligamento redondo. TRATAMIENTO  Sintese y reljese cuando el dolor comience.   Llevar las rodillas hacia el estmago.   Recustese sobre un lado con una almohada debajo del vientre (abdomen) y otra entre sus piernas.   Sintese en un bao caliente de 15 a 20 minutos o hasta que el dolor desaparezca.  INSTRUCCIONES PARA EL CUIDADO DOMICILIARIO  Utilice los medicamentos de venta libre o de  prescripcin para el dolor, el malestar o la fiebre, segn se lo indique el profesional que lo asiste.   Sintese y pngase de pie lentamente.   Evite caminatas largas si le ocasionan dolor.   Detenga o disminuya las actividades fsicas si le ocasionan dolor.  SOLICITE ATENCIN MDICA SI:  El dolor no desaparece con estas medidas.   Necesita que le prescriban medicamentos ms fuertes para el dolor.   Desarrolla un dolor de espalda que no haba sentido antes junto con el de lado.  SOLICITE ATENCIN MDICA DE INMEDIATO SI:  La temperatura se eleva por encima de 102 F (38.9 C) o ms.   Siente contracciones uterinas.   Presenta hemorragia vaginal.   Presenta nuseas, diarrea o vmitos.   Comienza a sentir escalofros   Siente dolor al orinar.  Document Released: 11/01/2008 Document Revised: 11/08/2011 [employer] Patient Information 2012 [employer]. 

## 2012-04-15 NOTE — MAU Note (Signed)
Patient is here with c/o lower abdominal pain that she describes as contractions that started today. She denies any vaginal bleeding, discharge. She states that she has not had a period since last pregnany, delivered oct22nd,2011. She states that sh ehad one period in 2012 (the baby was 8months). She states that she went to get implanon (patient can't remember the month), she was told that she was pregnant.

## 2012-04-16 LAB — GC/CHLAMYDIA PROBE AMP, GENITAL: GC Probe Amp, Genital: NEGATIVE

## 2012-04-21 ENCOUNTER — Ambulatory Visit (HOSPITAL_COMMUNITY)
Admission: RE | Admit: 2012-04-21 | Discharge: 2012-04-21 | Disposition: A | Discharge status: Home / Self Care | Payer: Self-pay | Source: Ambulatory Visit | Attending: Physician Assistant | Admitting: Physician Assistant

## 2012-04-21 DIAGNOSIS — O0930 Supervision of pregnancy with insufficient antenatal care, unspecified trimester: Secondary | ICD-10-CM | POA: Insufficient documentation

## 2012-04-21 DIAGNOSIS — O09299 Supervision of pregnancy with other poor reproductive or obstetric history, unspecified trimester: Secondary | ICD-10-CM | POA: Insufficient documentation

## 2012-04-21 DIAGNOSIS — Z8751 Personal history of pre-term labor: Secondary | ICD-10-CM | POA: Insufficient documentation

## 2012-04-21 DIAGNOSIS — Z3689 Encounter for other specified antenatal screening: Secondary | ICD-10-CM | POA: Insufficient documentation

## 2012-04-21 DIAGNOSIS — N949 Unspecified condition associated with female genital organs and menstrual cycle: Secondary | ICD-10-CM

## 2012-06-10 ENCOUNTER — Inpatient Hospital Stay (HOSPITAL_COMMUNITY)
Admission: AD | Admit: 2012-06-10 | Discharge: 2012-06-10 | Disposition: A | Discharge status: Home / Self Care | Payer: Self-pay | Source: Ambulatory Visit | Attending: Obstetrics & Gynecology | Admitting: Obstetrics & Gynecology

## 2012-06-10 ENCOUNTER — Inpatient Hospital Stay (HOSPITAL_COMMUNITY): Payer: Self-pay

## 2012-06-10 ENCOUNTER — Encounter (HOSPITAL_COMMUNITY): Payer: Self-pay

## 2012-06-10 DIAGNOSIS — O09219 Supervision of pregnancy with history of pre-term labor, unspecified trimester: Secondary | ICD-10-CM

## 2012-06-10 DIAGNOSIS — O4700 False labor before 37 completed weeks of gestation, unspecified trimester: Secondary | ICD-10-CM | POA: Insufficient documentation

## 2012-06-10 HISTORY — DX: Other specified health status: Z78.9

## 2012-06-10 LAB — OB RESULTS CONSOLE ANTIBODY SCREEN: Antibody Screen: NEGATIVE

## 2012-06-10 LAB — OB RESULTS CONSOLE RPR: RPR: NONREACTIVE

## 2012-06-10 NOTE — MAU Note (Signed)
Andrea Wise CNM in with patient to discuss (with interpreter) [place] and cervical length.

## 2012-06-10 NOTE — MAU Note (Signed)
Pt reports having cramping for one month and feels them at least once or twice  per day which is instant then goes away.

## 2012-06-10 NOTE — MAU Note (Signed)
Patient states she was seen today at [facility] for a regular appointment and was told her cervix was thin and sent to MAU for evaluation. Patient states occasional contractions but none now. Denies any bleeding or leaking but does have a mucus discharge. Reports good fetal movement.

## 2012-06-10 NOTE — MAU Provider Note (Signed)
Chief Complaint:  Labor Eval   First Provider Initiated Contact with Patient 06/10/12 2018      HPI  Andrea Wise is a 31 y.o. [id number] at [redacted]w[redacted]d presents to MAU reporting being told that her cervix was short [facility]. She reports rare mild UC's and denies leakage of fluid or vaginal bleeding. Good fetal movement. Hx of PTD due to PTL at 7 months with her first child. No 17-P this pregnancy.    Past Medical History: Past Medical History  Diagnosis Date  . No pertinent past medical history     Past Surgical History: Past Surgical History  Procedure Date  . Appendectomy     Family History: Family History  Problem Relation Age of Onset  . Other Neg Hx     Social History: History  Substance Use Topics  . Smoking status: Never Smoker   . Smokeless tobacco: Not on file  . Alcohol Use: No    Allergies: No Known Allergies  Meds:  Prescriptions prior to admission  Medication Sig Dispense Refill  . Prenatal Vit-Fe Fumarate-FA (PRENATAL MULTIVITAMIN) TABS Take 1 tablet by mouth at bedtime.          Physical Exam  Blood pressure 119/62, pulse 89, temperature 98.3 F (36.8 C), temperature source Oral, resp. rate 16, height 5' 0.5" (1.537 m), weight 74.571 kg (164 lb 6.4 oz), SpO2 99.00%. GENERAL: Well-developed, well-nourished female in no acute distress.  HEENT: normocephalic, good dentition HEART: normal rate RESP: normal effort ABDOMEN: Soft, nontender, gravid appropriate for gestational age EXTREMITIES: Nontender, no edema NEURO: alert and oriented  SPECULUM EXAM: Deferred. No FFN done to cervical exam >24 hours ago  Dilation: Closed internal os, FT external os Effacement (%): Thick Cervical Position: Posterior Station: Ballotable Exam by:: Renee Rival CNM  FHT:  Baseline 130 , moderate variability, accelerations present, no decelerations Contractions: rare, mild   Labs: NA  Imaging:    Assessment: 1.  History of preterm delivery, currently pregnant. No evidence of preterm labor at this visit.    Plan: D/C home Preterm labor precautions Follow-up Information    Schedule an appointment as soon as possible for a visit with [facility] [place].   Contact information:   [address] [facility] [address]       Follow up with [facility]. (As needed if symptoms worsen)    Contact information:   [address] [place] [address] [phone]        Medication List  As of 06/12/2012 12:58 AM   CONTINUE taking these medications         prenatal multivitamin Tabs           Jacorey Donaway 7/9/20139:00 PM

## 2012-06-12 NOTE — MAU Provider Note (Signed)
Agree with above note.  Andrea Wise 06/12/2012 9:31 AM

## 2012-07-23 LAB — OB RESULTS CONSOLE GBS: GBS: NEGATIVE

## 2012-08-16 ENCOUNTER — Inpatient Hospital Stay (HOSPITAL_COMMUNITY)
Admission: AD | Admit: 2012-08-16 | Discharge: 2012-08-17 | DRG: 775 | Disposition: A | Discharge status: Home / Self Care | Payer: Medicaid Other | Source: Ambulatory Visit | Attending: Obstetrics & Gynecology | Admitting: Obstetrics & Gynecology

## 2012-08-16 ENCOUNTER — Encounter (HOSPITAL_COMMUNITY): Payer: Self-pay | Admitting: *Deleted

## 2012-08-16 LAB — CBC
HCT: 35 % — ABNORMAL LOW (ref 36.0–46.0)
MCHC: 32.9 g/dL (ref 30.0–36.0)
Platelets: 139 10*3/uL — ABNORMAL LOW (ref 150–400)
RDW: 15.9 % — ABNORMAL HIGH (ref 11.5–15.5)

## 2012-08-16 LAB — ABO/RH: ABO/RH(D): B POS

## 2012-08-16 LAB — RPR: RPR Ser Ql: NONREACTIVE

## 2012-08-16 LAB — TYPE AND SCREEN: ABO/RH(D): B POS

## 2012-08-16 MED ORDER — SIMETHICONE 80 MG PO CHEW: 80.0000 mg | CHEWABLE_TABLET | ORAL | Status: DC | PRN

## 2012-08-16 MED ORDER — OXYCODONE-ACETAMINOPHEN 5-325 MG PO TABS: 1.0000 | ORAL_TABLET | ORAL | Status: DC | PRN

## 2012-08-16 MED ORDER — ONDANSETRON HCL 4 MG PO TABS: 4.0000 mg | ORAL_TABLET | ORAL | Status: DC | PRN

## 2012-08-16 MED ORDER — DIBUCAINE 1 % RE OINT: 1.0000 "application " | TOPICAL_OINTMENT | RECTAL | Status: DC | PRN

## 2012-08-16 MED ORDER — TETANUS-DIPHTH-ACELL PERTUSSIS 5-2.5-18.5 LF-MCG/0.5 IM SUSP: 0.5000 mL | Freq: Once | INTRAMUSCULAR | Status: DC

## 2012-08-16 MED ORDER — IBUPROFEN 600 MG PO TABS
600.0000 mg | ORAL_TABLET | Freq: Four times a day (QID) | ORAL | Status: DC
  Administered 2012-08-16 – 2012-08-17 (×6): 600 mg via ORAL
  Filled 2012-08-16 (×6): qty 1

## 2012-08-16 MED ORDER — ZOLPIDEM TARTRATE 5 MG PO TABS: 5.0000 mg | ORAL_TABLET | Freq: Every evening | ORAL | Status: DC | PRN

## 2012-08-16 MED ORDER — BENZOCAINE-MENTHOL 20-0.5 % EX AERO: 1.0000 "application " | INHALATION_SPRAY | CUTANEOUS | Status: DC | PRN

## 2012-08-16 MED ORDER — OXYTOCIN BOLUS FROM INFUSION
500.0000 mL | Freq: Once | INTRAVENOUS | Status: AC
  Administered 2012-08-16: 500 mL via INTRAVENOUS
  Filled 2012-08-16: qty 500

## 2012-08-16 MED ORDER — NALBUPHINE HCL 10 MG/ML IJ SOLN
10.0000 mg | INTRAMUSCULAR | Status: DC | PRN
  Administered 2012-08-16: 10 mg via INTRAVENOUS
  Filled 2012-08-16: qty 1

## 2012-08-16 MED ORDER — ONDANSETRON HCL 4 MG/2ML IJ SOLN: 4.0000 mg | Freq: Four times a day (QID) | INTRAMUSCULAR | Status: DC | PRN

## 2012-08-16 MED ORDER — CITRIC ACID-SODIUM CITRATE 334-500 MG/5ML PO SOLN: 30.0000 mL | ORAL | Status: DC | PRN

## 2012-08-16 MED ORDER — WITCH HAZEL-GLYCERIN EX PADS: 1.0000 "application " | MEDICATED_PAD | CUTANEOUS | Status: DC | PRN

## 2012-08-16 MED ORDER — INFLUENZA VIRUS VACC SPLIT PF IM SUSP
0.5000 mL | INTRAMUSCULAR | Status: AC
  Administered 2012-08-17: 0.5 mL via INTRAMUSCULAR
  Filled 2012-08-16: qty 0.5

## 2012-08-16 MED ORDER — OXYTOCIN 40 UNITS IN LACTATED RINGERS INFUSION - SIMPLE MED: 62.5000 mL/h | Freq: Once | INTRAVENOUS | Status: DC

## 2012-08-16 MED ORDER — PRENATAL MULTIVITAMIN CH
1.0000 | ORAL_TABLET | Freq: Every day | ORAL | Status: DC
  Administered 2012-08-16 – 2012-08-17 (×2): 1 via ORAL
  Filled 2012-08-16: qty 1

## 2012-08-16 MED ORDER — LACTATED RINGERS IV SOLN
INTRAVENOUS | Status: DC
  Administered 2012-08-16 (×2): via INTRAVENOUS

## 2012-08-16 MED ORDER — IBUPROFEN 600 MG PO TABS: 600.0000 mg | ORAL_TABLET | Freq: Four times a day (QID) | ORAL | Status: DC | PRN

## 2012-08-16 MED ORDER — LANOLIN HYDROUS EX OINT: TOPICAL_OINTMENT | CUTANEOUS | Status: DC | PRN

## 2012-08-16 MED ORDER — MISOPROSTOL 200 MCG PO TABS
800.0000 ug | ORAL_TABLET | Freq: Once | ORAL | Status: AC
  Administered 2012-08-16: 800 ug via RECTAL

## 2012-08-16 MED ORDER — ONDANSETRON HCL 4 MG/2ML IJ SOLN: 4.0000 mg | INTRAMUSCULAR | Status: DC | PRN

## 2012-08-16 MED ORDER — SENNOSIDES-DOCUSATE SODIUM 8.6-50 MG PO TABS
2.0000 | ORAL_TABLET | Freq: Every day | ORAL | Status: DC
  Administered 2012-08-16: 2 via ORAL

## 2012-08-16 MED ORDER — LACTATED RINGERS IV SOLN: 500.0000 mL | INTRAVENOUS | Status: DC | PRN

## 2012-08-16 MED ORDER — ACETAMINOPHEN 325 MG PO TABS: 650.0000 mg | ORAL_TABLET | ORAL | Status: DC | PRN

## 2012-08-16 MED ORDER — MISOPROSTOL 200 MCG PO TABS
ORAL_TABLET | ORAL | Status: AC
  Filled 2012-08-16: qty 4

## 2012-08-16 MED ORDER — LIDOCAINE HCL (PF) 1 % IJ SOLN
30.0000 mL | INTRAMUSCULAR | Status: DC | PRN
  Filled 2012-08-16: qty 30

## 2012-08-16 MED ORDER — DIPHENHYDRAMINE HCL 25 MG PO CAPS: 25.0000 mg | ORAL_CAPSULE | Freq: Four times a day (QID) | ORAL | Status: DC | PRN

## 2012-08-16 MED ORDER — OXYTOCIN 40 UNITS IN LACTATED RINGERS INFUSION - SIMPLE MED
INTRAVENOUS | Status: AC
  Filled 2012-08-16: qty 1000

## 2012-08-16 NOTE — H&P (Signed)
Andrea Wise is a 31 y.o. female presenting for SOOL.  Starting having ctx early in the morning, became intense around 1:30am.  Cervix changed from 6 to 8 within 30 min in MAU.  Very uncomfortable with contractions.  Maternal Medical History:  Reason for admission: Reason for admission: contractions and nausea.  Contractions: Onset was 3-5 hours ago.   Frequency: regular.   Perceived severity is strong.    Fetal activity: Perceived fetal activity is normal.    Prenatal complications: No bleeding, hypertension, pre-eclampsia or substance abuse.   Prenatal Complications - Diabetes: none.    OB History    Grav Para Term Preterm Abortions TAB SAB Ect Mult Living   5 3 2 1 1  1   3      Past Medical History  Diagnosis Date  . No pertinent past medical history    Past Surgical History  Procedure Date  . Appendectomy    Family History: family history is negative for Other. Social History:  reports that she has never smoked. She does not have any smokeless tobacco history on file. She reports that she does not drink alcohol or use illicit drugs.   Prenatal Transfer Tool  Maternal Diabetes: No Genetic Screening: Declined Maternal Ultrasounds/Referrals: Normal Fetal Ultrasounds or other Referrals:  None Maternal Substance Abuse:  No Significant Maternal Medications:  None Significant Maternal Lab Results:  Lab values include: Group B Strep negative Other Comments:  None  Review of Systems  Gastrointestinal: Positive for nausea and abdominal pain.    Dilation: 8 Effacement (%): 100 Station: -1 Exam by:: Rudi Coco RN Blood pressure 134/79, pulse 78, temperature 98 F (36.7 C), temperature source Oral, resp. rate 20, height 5' (1.524 m), weight 80.196 kg (176 lb 12.8 oz). Maternal Exam:  Uterine Assessment: Contraction strength is firm.  Contraction frequency is regular.   Abdomen: Fetal presentation: vertex  Introitus: Normal vulva. Normal vagina.  Pelvis:  adequate for delivery.   Cervix: Cervix evaluated by digital exam.     Fetal Exam Fetal Monitor Review: Baseline rate: 125.  Variability: moderate (6-25 bpm).   Pattern: accelerations present.    Fetal State Assessment: Category I - tracings are normal.     Physical Exam  Constitutional: She is oriented to person, place, and time. She appears well-developed and well-nourished.  Cardiovascular: Normal rate and regular rhythm.   Respiratory: Effort normal and breath sounds normal.  GI:       gravid  Musculoskeletal: She exhibits no edema.  Neurological: She is alert and oriented to person, place, and time.  Skin: Skin is warm and dry.    Prenatal labs: ABO, Rh: B/Positive/-- (07/09 0000) Antibody: Negative (07/09 0000) Rubella: Immune (07/09 0000) RPR: Nonreactive (07/09 0000)  HBsAg: Negative (07/09 0000)  HIV: Non-reactive (07/09 0000)  GBS: Negative (08/21 0000)   Assessment/Plan: 31 yo [id number] at 40.0 p/w SOOL -Admit to L&D   MCGILL,JACQUELYN 08/16/2012, 3:19 AM  I have seen and examined this patient and I agree with the above. Cam Hai 3:44 AM 08/16/2012

## 2012-08-16 NOTE — MAU Note (Signed)
Pt very uncomfortable brought directly to room 5.

## 2012-08-16 NOTE — Clinical Social Work Note (Signed)
Clinical Social Work Department PSYCHOSOCIAL ASSESSMENT - MATERNAL/CHILD Name:  Andrea Wise Age: 30 days Referral Date:  08/16/12 Referral source:  MD Referral reason:  [facility]  I. FAMILY/HOME ENVIRONMENT  Child's legal guardian: Parent(s) Perlie Mayo parent    DSS  Name:  Najai Waszak Age:  31 y/o Address: [address], [place], [place] [address] Name:  Wilford Corner Address:  Different from pt.  Other Household Members/Support Persons Relationship  Cousin        Relationship  Dtr                   DOB  31 y/o                   Relationship  Dtr                   DOB  9 y/0                   Relationship  Son                   DOB  1 yr and 11 mths C.   Other Support   II. PSYCHOSOCIAL DATA A. Information Source  Patient Interview X  Family Interview           Other B. Event organiser Employment  No [employer]     [employer]              Self Pay   Yes Food Stamps      WIC Work Scientist, physiological Housing      Section 8    Maternity Care Coordination/Child Service Coordination/Early Intervention   School Grade      Other Cultural and Environment Information Cultural Issues Impacting Care  III. STRENGTHS  Supportive family/friends Yes  Adequate Resources   Yes Compliance with medical plan  Home prepared for Child (including basic supplies)  Yes Understanding of illness           Other  IV. RISK FACTORS AND CURRENT PROBLEMS       V. No Problems Noted VI. Substance Abuse                                           Pt Family VII. Mental Illness     Pt Family               Family/Relationship Issues   Pt Family      Abuse/Neglect/Domestic Violence   Pt Family   Financial Resources     Pt Family  Transportation     Pt Family  DSS Involvement    Pt Family  Adjustment to Illness    Pt Family   Knowledge/Cognitive Deficit   Pt Family   Compliance with Treatment   Pt Family   Basic Needs (food,  housing, etc)  Pt Family  Housing Concerns    Pt Family  Other             VIII. SOCIAL WORK ASSESSMENT SW received referral due to late prenatal care.  Pt stated she did not know she was pregnant.  She went to a regularly scheduled doctor's appointment and found out.  She currently lives with her cousin because she is separated from her husband.  Pt has three other children living with her.  She stated her cousin is taking care of all of her expenses and bills.  She stated she hopes to re-unite with her husband at some point.  Due to [facility], UDS was conducted which was negative.  MEC is pending.  SW utilized [place] interpreter during visit.  Pt stated she understood the drug screen.  Pt reported having adequate supplies for the baby and has a good support system.  IX. SOCIAL WORK PLAN (In [place]) No Further Intervention Required/No Barriers to Discharge Psychosocial Support and Ongoing Assessment of Needs Patient/Family  Education Child Protective Services Report  [place]        Date Information/Referral to [facility]   Other

## 2012-08-17 MED ORDER — IBUPROFEN 600 MG PO TABS: 600.0000 mg | ORAL_TABLET | Freq: Four times a day (QID) | ORAL | Status: AC

## 2012-08-17 NOTE — Progress Notes (Signed)
In-house Spanish Interpreter 'Trinna Post' at bedside while explaining to pt the purpose and procedure for PKU and heart screen. Patient stated her understanding and acceptance of procedures.

## 2012-08-17 NOTE — Discharge Summary (Signed)
Obstetric Discharge Summary Reason for Admission: onset of labor Prenatal Procedures: none Intrapartum Procedures: spontaneous vaginal delivery Postpartum Procedures: none Complications-Operative and Postpartum: none Hemoglobin  Date Value Range Status  08/16/2012 11.5* 12.0 - 15.0 g/dL Final     HCT  Date Value Range Status  08/16/2012 35.0* 36.0 - 46.0 % Final    Ms. Andrea Wise is a 31 yo [id number] who presented to MAU with regular contractions and was found to be in labor.  She progressed normally and delivered a liveborn term female on 9/14 at 0347 AM via SVD. No complications or lacerations.  She was monitored post delivery and on PPD#1 was requesting to go home.  No concerns at this time. She desires nexplanon for contraception.  Plan to f/u in 6 weeks. Breast and bottle feeding.   Physical Exam:  General: alert, cooperative and no distress Lochia: appropriate Uterine Fundus: firm Incision: na DVT Evaluation: No evidence of DVT seen on physical exam. No cords or calf tenderness. No significant calf/ankle edema.  Discharge Diagnoses: Term Pregnancy-delivered  Discharge Information: Date: 08/17/2012 Activity: pelvic rest Diet: routine Medications: PNV, Ibuprofen and Colace Condition: stable Instructions: refer to practice specific booklet Discharge to: home   Newborn Data: Live born female  Birth Weight: 8 lb 1.5 oz (3670 g) APGAR: 9, 9  Home with mother.  Rulon Abide 08/17/2012, 11:41 AM  I examined pt and agree with documentation above and resident plan of care. [facility]

## 2012-08-17 NOTE — Progress Notes (Signed)
Notified Resident patient would like to be discharged today.

## 2012-08-17 NOTE — Progress Notes (Signed)
Post Partum Day 1 Subjective: no complaints, up ad lib, voiding and tolerating PO  Objective: Blood pressure 106/69, pulse 80, temperature 98.4 F (36.9 C), temperature source Oral, resp. rate 18, height 5' (1.524 m), weight 80.196 kg (176 lb 12.8 oz), SpO2 98.00%, unknown if currently breastfeeding.  Physical Exam:  General: alert, cooperative and no distress Lochia: appropriate Uterine Fundus: firm Incision: n/a DVT Evaluation: No evidence of DVT seen on physical exam. Negative Homan's sign. No cords or calf tenderness. No significant calf/ankle edema.   Basename 08/16/12 0315  HGB 11.5*  HCT 35.0*    Assessment/Plan: Plan for discharge tomorrow, Breastfeeding and Contraception implanon/nexplanon   LOS: 1 day   Marge Duncans 08/17/2012, 6:30 AM

## 2012-08-18 NOTE — Progress Notes (Signed)
Post discharge chart review completed.  

## 2012-08-25 ENCOUNTER — Encounter (HOSPITAL_COMMUNITY): Payer: Self-pay | Admitting: *Deleted

## 2012-12-16 ENCOUNTER — Emergency Department (HOSPITAL_COMMUNITY)
Admission: EM | Admit: 2012-12-16 | Discharge: 2012-12-16 | Discharge status: Against Medical Advice | Payer: Self-pay | Attending: Emergency Medicine | Admitting: Emergency Medicine

## 2012-12-16 ENCOUNTER — Encounter (HOSPITAL_COMMUNITY): Payer: Self-pay | Admitting: *Deleted

## 2012-12-16 DIAGNOSIS — R6884 Jaw pain: Secondary | ICD-10-CM | POA: Insufficient documentation

## 2012-12-16 NOTE — ED Notes (Signed)
No answer in waiting room when called for tx room

## 2012-12-16 NOTE — ED Notes (Signed)
Pt called in lobby with no response 

## 2012-12-16 NOTE — ED Notes (Signed)
Pt states 30 minutes after having long discussion her jaw became misaligned.  Jaw appears aligned presently, but pt states whenever she attempts to speak she feels pain to both sides of jaw.  Pt had similar experience 12 years prior which resolved on it's own.

## 2014-08-22 ENCOUNTER — Encounter (HOSPITAL_COMMUNITY): Payer: Self-pay | Admitting: Emergency Medicine

## 2014-08-22 ENCOUNTER — Emergency Department (HOSPITAL_COMMUNITY): Payer: Medicaid Other

## 2014-08-22 ENCOUNTER — Emergency Department (HOSPITAL_COMMUNITY)
Admission: EM | Admit: 2014-08-22 | Discharge: 2014-08-23 | Disposition: A | Discharge status: Home / Self Care | Payer: Medicaid Other | Attending: Emergency Medicine | Admitting: Emergency Medicine

## 2014-08-22 DIAGNOSIS — R079 Chest pain, unspecified: Secondary | ICD-10-CM | POA: Insufficient documentation

## 2014-08-22 DIAGNOSIS — M549 Dorsalgia, unspecified: Secondary | ICD-10-CM | POA: Insufficient documentation

## 2014-08-22 DIAGNOSIS — R071 Chest pain on breathing: Secondary | ICD-10-CM | POA: Insufficient documentation

## 2014-08-22 DIAGNOSIS — Z3202 Encounter for pregnancy test, result negative: Secondary | ICD-10-CM | POA: Insufficient documentation

## 2014-08-22 DIAGNOSIS — R0789 Other chest pain: Secondary | ICD-10-CM

## 2014-08-22 LAB — CBC
HCT: 37.8 % (ref 36.0–46.0)
Hemoglobin: 13 g/dL (ref 12.0–15.0)
MCH: 27.5 pg (ref 26.0–34.0)
MCHC: 34.4 g/dL (ref 30.0–36.0)
MCV: 79.9 fL (ref 78.0–100.0)
Platelets: 251 10*3/uL (ref 150–400)
RBC: 4.73 MIL/uL (ref 3.87–5.11)
RDW: 13.4 % (ref 11.5–15.5)
WBC: 8.6 10*3/uL (ref 4.0–10.5)

## 2014-08-22 LAB — BASIC METABOLIC PANEL
Anion gap: 14 (ref 5–15)
BUN: 15 mg/dL (ref 6–23)
CHLORIDE: 100 meq/L (ref 96–112)
CO2: 22 meq/L (ref 19–32)
Calcium: 9.5 mg/dL (ref 8.4–10.5)
Creatinine, Ser: 0.5 mg/dL (ref 0.50–1.10)
GFR calc Af Amer: >90 mL/min (ref >90)
GFR calc non Af Amer: >90 mL/min (ref >90)
Glucose, Bld: 154 mg/dL — ABNORMAL HIGH (ref 70–99)
Potassium: 3.9 mEq/L (ref 3.7–5.3)
Sodium: 136 mEq/L — ABNORMAL LOW (ref 137–147)

## 2014-08-22 LAB — I-STAT TROPONIN, ED: Troponin i, poc: 0.01 ng/mL (ref 0.00–0.08)

## 2014-08-22 LAB — POC URINE PREG, ED: PREG TEST UR: NEGATIVE

## 2014-08-22 MED ORDER — RANITIDINE HCL 150 MG PO CAPS: 150.0000 mg | ORAL_CAPSULE | Freq: Every day | ORAL | Status: DC

## 2014-08-22 NOTE — Discharge Instructions (Signed)
Dolor de la pared torácica °(Chest Wall Pain) °Dolor en la pared torácica es dolor en o alrededor de los huesos y músculos de su pecho. Podrán pasar hasta 6 semanas hasta que comience a mejorar. Puede demorar más tiempo si es físicamente activo en su trabajo y actividades.  °CAUSAS  °El dolor en el pecho puede aparecer sin motivo. No obstante, algunas causas pueden ser:  °· Una enfermedad viral como la gripe. °· Traumatismos. °· Tos. °· La práctica de ejercicios. °· Artritis. °· Fibromialgia °· Culebrilla. °INSTRUCCIONES PARA EL CUIDADO DOMICILIARIO °· Evite hacer actividad física extenuante. Trate de no esforzarse o realizar actividades que le causen dolor. Aquí se incluyen las actividades en las que usa los músculos del tórax, los abdominales y los músculos laterales, especialmente si debe levantar objetos pesados. °· Aplique hielo sobre la zona dolorida. °¨ Ponga el hielo en una bolsa plástica. °¨ Colóquese una toalla entre la piel y la bolsa de hielo. °¨ Deje la bolsa de hielo durante 15 a 20 minutos por hora, durante los primeros 2 días. °· Utilice los medicamentos de venta libre o de prescripción para el dolor, el malestar o la fiebre, según se lo indique el profesional que lo asiste. °SOLICITE ATENCIÓN MÉDICA DE INMEDIATO SI: °· El dolor aumenta o siente muchas molestias. °· Tiene fiebre. °· El dolor de pecho empeora. °· Desarrolla nuevos e inexplicables síntomas. °· Tiene náuseas o vómitos. °· Transpira o se siente mareado. °· Tiene tos con flema (esputo), o tose con sangre. °ESTÉ SEGURO QUE:  °· Comprende las instrucciones para el alta médica. °· Controlará su enfermedad. °· Solicitará atención médica de inmediato según las indicaciones. °Document Released: 12/31/2006 Document Revised: 02/11/2012 °ExitCare® Patient Information ©2015 ExitCare, LLC. This information is not intended to replace advice given to you by your health care provider. Make sure you discuss any questions you have with your health care  provider. ° °

## 2014-08-22 NOTE — ED Provider Notes (Signed)
CSN: [id number]     Arrival date & time 08/22/14  2041 History   First MD Initiated Contact with Patient 08/22/14 2145     Chief Complaint  Patient presents with  . Chest Pain   Patient is a 33 y.o. female presenting with chest pain. The history is provided by the patient.  Chest Pain Pain location:  L chest Pain quality: aching   Pain radiates to:  L arm Pain radiates to the back: yes   Pain severity:  Moderate Onset quality:  Gradual Duration:  3 weeks Timing:  Intermittent Progression:  Waxing and waning Chronicity:  New Context: eating (worse with bread and coffee), raising an arm (but also at rest) and at rest   Context: not breathing   Ineffective treatments:  Leaning forward Associated symptoms: back pain   Associated symptoms: no abdominal pain, no cough, no dysphagia, no fever, no heartburn, no lower extremity edema, no nausea, no near-syncope, no orthopnea, no palpitations, no shortness of breath, no syncope and no weakness   Risk factors: no coronary artery disease, no diabetes mellitus, no high cholesterol, no hypertension, no immobilization, no prior DVT/PE and no smoking    Pain does not worsen with lying flat.  No sour taste in mouth.    Past Medical History  Diagnosis Date  . No pertinent past medical history    Past Surgical History  Procedure Laterality Date  . Appendectomy     Family History  Problem Relation Age of Onset  . Other Neg Hx    History  Substance Use Topics  . Smoking status: Never Smoker   . Smokeless tobacco: Not on file  . Alcohol Use: No   OB History   Grav Para Term Preterm Abortions TAB SAB Ect Mult Living   Review of Systems  Constitutional: Negative for fever.  HENT: Negative for trouble swallowing.   Respiratory: Negative for cough and shortness of breath.   Cardiovascular: Positive for chest pain. Negative for palpitations, orthopnea, syncope and near-syncope.  Gastrointestinal: Negative for  heartburn, nausea and abdominal pain.  Musculoskeletal: Positive for back pain.  Neurological: Negative for weakness.  All other systems reviewed and are negative.     Allergies  Review of patient's allergies indicates no known allergies.  Home Medications   Prior to Admission medications   Medication Sig Start Date End Date Taking? Authorizing Provider  Prenatal Vit-Fe Fumarate-FA (PRENATAL MULTIVITAMIN) TABS Take 1 tablet by mouth at bedtime.    Historical Provider, MD   BP 122/73  Pulse 82  Temp(Src) 98.5 F (36.9 C) (Oral)  Resp 18  SpO2 99% Physical Exam  Nursing note and vitals reviewed. Constitutional: She is oriented to person, place, and time. She appears well-developed and well-nourished. No distress.  HENT:  Head: Normocephalic and atraumatic.  Nose: Nose normal.  Eyes: Conjunctivae are normal.  Neck: Normal range of motion. Neck supple. No tracheal deviation present.  Cardiovascular: Normal rate, regular rhythm and normal heart sounds.   No murmur heard. Pulmonary/Chest: Effort normal and breath sounds normal. No respiratory distress. She has no rales. She exhibits tenderness (left lateral chest palpation reproduces pain exactly as previously experienced ).  Abdominal: Soft. Bowel sounds are normal. She exhibits no distension and no mass. There is no tenderness.  Musculoskeletal: Normal range of motion. She exhibits no edema.  No lower extremity edema, calf tenderness, warmth, erythema or palpable cords  Neurological: She  is alert and oriented to person, place, and time.  Skin: Skin is warm and dry. No rash noted.  Psychiatric: She has a normal mood and affect.    ED Course  Procedures (including critical care time) Labs Review Labs Reviewed  BASIC METABOLIC PANEL - Abnormal; Notable for the following:    Sodium 136 (*)    Glucose, Bld 154 (*)    All other components within normal limits  CBC  I-STAT TROPOININ, ED  POC URINE PREG, ED    Imaging  Review Dg Chest 2 View  08/22/2014   CLINICAL DATA:  Left-sided chest pain  EXAM: CHEST  2 VIEW  COMPARISON:  05/03/2010  FINDINGS: Normal heart size and mediastinal contours. No acute infiltrate or edema. No effusion or pneumothorax. No acute osseous findings.  IMPRESSION: No active cardiopulmonary disease.   Electronically Signed   By: Tiburcio Pea M.D.   On: 08/22/2014 23:35    MDM   Final diagnoses:  Chest wall pain    Pt presents with atypical chest pain.  No significant risk factors.  HEAR score 1.  No signs of DVT, no PE symptoms, no tachypnea or tachycardia. PERC negative.  Pt notes pain sometimes with eating and drinking coffee, may be related to gastritis or esophagitis. D/c with zantac.   Pain is reproducible with palpation and is the exact pain she has come to ED for.  No infectious symptoms per hx, lungs CTAB.  Pain for 3 weeks, trop negative. EKG without ischemic changes.  No exertional symptoms, doubt ACS.   F/u contact info given for PCP.  Stable for discharge home.     Sofie Rower, MD 08/23/14 [phone]

## 2014-08-22 NOTE — ED Notes (Signed)
Pt is here with 3 weeks of left sided chest pain that radiates to back and down left arm.  PT states LMP 2months ago

## 2014-08-22 NOTE — ED Notes (Signed)
MD at bedside w/ spanish interpreter phone at bedside

## 2014-08-23 NOTE — ED Provider Notes (Signed)
I saw and evaluated the patient, reviewed the resident's note and I agree with the findings and plan.   EKG Interpretation   Date/Time:  Sunday August 22 2014 20:50:58 EDT Ventricular Rate:  83 PR Interval:  164 QRS Duration: 80 QT Interval:  360 QTC Calculation: 423 R Axis:   90 Text Interpretation:  Normal sinus rhythm Rightward axis Cannot rule out  Anterior infarct , age undetermined Abnormal ECG No significant change was  found Confirmed by Neamiah Sciarra  MD, Stormie Ventola ([id number]) on 08/23/2014 12:41:32 AM      Atypical chest pain.  Doubt ACS.  Doubt pulmonary embolism.  Overall well-appearing.  Discharge home in good condition.  Home with Zantac.  Lyanne Co, MD 08/23/14 [phone]

## 2014-10-04 ENCOUNTER — Encounter (HOSPITAL_COMMUNITY): Payer: Self-pay | Admitting: Emergency Medicine

## 2015-12-31 ENCOUNTER — Encounter (HOSPITAL_COMMUNITY): Payer: Self-pay | Admitting: *Deleted

## 2015-12-31 ENCOUNTER — Emergency Department (HOSPITAL_COMMUNITY)
Admission: EM | Admit: 2015-12-31 | Discharge: 2015-12-31 | Disposition: A | Discharge status: Home / Self Care | Payer: Medicaid Other | Attending: Emergency Medicine | Admitting: Emergency Medicine

## 2015-12-31 DIAGNOSIS — L600 Ingrowing nail: Secondary | ICD-10-CM | POA: Insufficient documentation

## 2015-12-31 DIAGNOSIS — Z79899 Other long term (current) drug therapy: Secondary | ICD-10-CM | POA: Insufficient documentation

## 2015-12-31 MED ORDER — CEPHALEXIN 500 MG PO CAPS: 500.0000 mg | ORAL_CAPSULE | Freq: Four times a day (QID) | ORAL | Status: DC

## 2015-12-31 MED ORDER — NAPROXEN 500 MG PO TABS: 500.0000 mg | ORAL_TABLET | Freq: Two times a day (BID) | ORAL | Status: DC

## 2015-12-31 NOTE — ED Notes (Signed)
Pt and family reports right big toe having ingrown toe nail that is causing pain.

## 2015-12-31 NOTE — Discharge Instructions (Signed)
Naprosyn for pain as prescribed. Keflex as prescribed until all gone. Do warm water soaks several times a day. Follow up with podiatrist. Return if worsening.    Ua del pie encarnada (Ingrown Toenail) La ua del pie encarnada se produce cuando las esquinas o los costados de la ua crecen hacia la piel circundante. Es ms frecuente en el dedo gordo, pero puede ocurrir en cualquier dedo del pie. Si la ua del pie encarnada no se trata, puede correr riesgo de infectarse. CAUSAS Este trastorno puede ser causado por:  Uso de calzado muy pequeo o apretado.  Lesin o traumatismo, por ejemplo, al golpearse el dedo contra algo o si alguien se lo pisa.  Cuidado inadecuado de las uas del pie o uas mal cortadas.  Anomalas presentes desde el nacimiento en las uas o los pies (congnitas), por ejemplo, una ua muy grande para el dedo. FACTORES DE RIESGO [address] factores de riesgo de tener una ua del pie encarnada, se incluyen los siguientes:  La edad. Las uas tienden a Scientist, product/process development con el paso del [place], por lo que las uas encarnadas son ms frecuentes en las personas de [place].  Diabetes.  Uas del pie mal cortadas.  Problemas en la circulacin sangunea. SNTOMAS Entre los sntomas se pueden incluir los siguientes:  Inflamacin o dolor y sensibilidad con la palpacin.  Enrojecimiento.  Hinchazn.  Endurecimiento de la piel alrededor del dedo. Si nota lquido, pus o supuracin, la ua del pie encarnada puede estar infectada. DIAGNSTICO  La ua del pie encarnada se puede diagnosticar mediante la historia clnica y un examen fsico. Si la ua est infectada, el mdico puede analizar una muestra del lquido que supura. TRATAMIENTO El tratamiento depende de la gravedad de la ua del pie encarnada. Algunos casos pueden tratarse en casa; otros casos ms graves o en los que la ua se infecta requieren [place] para extirpar la ua total o parcialmente. Las uas del pie encarnadas  e infectadas tambin pueden tratarse con antibiticos. INSTRUCCIONES PARA EL CUIDADO EN EL HOGAR  Si le recetaron antibiticos, asegrese de terminarlos, incluso si comienza a sentirse mejor.  Remoje el pie en agua tibia jabonosa durante , 3veces al da, o como se lo haya indicado el mdico.  Separe con cuidado el borde de la ua de la piel dolorida e introduzca un pequeo trozo de algodn debajo de la esquina de la ua. Esto Teacher, early years/pre. Tenga cuidado de no lesionar ms el rea.  Use zapatos que calcen bien. En caso de que la ua del pie encarnada le cause dolor, intente usar sandalias, si es posible.  Crtese las uas de los pies con cuidado y de forma regular. No las corte de forma curva. Crtese las uas de los pies en lnea recta, para evitar lesiones en la piel en las esquinas de las uas.  Mantenga los pies limpios y secos.  Si tiene problemas para caminar y Biochemist, clinical, selas segn las indicaciones.  No se toque la ua del pie ni trate de quitarla por su cuenta.  Tome los medicamentos solamente como se lo haya indicado el mdico.  Concurra a todas las visitas de control como se lo haya indicado el mdico. Esto es importante. SOLICITE ATENCIN MDICA SI:  Los sntomas no mejoran con Scientist, research (medical). SOLICITE ATENCIN MDICA DE INMEDIATO SI:  Tiene lneas rojas que comienzan en el pie y continan en la pierna.  Tiene fiebre.  El enrojecimiento, la hinchazn o el dolor [place].  Observa lquido, sangre o pus que sale de la ua del pie.   Esta informacin no tiene Theme park manager el consejo del mdico. Asegrese de hacerle al mdico cualquier pregunta que tenga.   Document Released: 11/19/2005 Document Revised: 04/05/2015 Elsevier Interactive Patient Education [employer]

## 2015-12-31 NOTE — ED Provider Notes (Signed)
CSN: [id number]     Arrival date & time 12/31/15  1306 History   First MD Initiated Contact with Patient 12/31/15 1349     Chief Complaint  Patient presents with  . Toe Pain     (Consider location/radiation/quality/duration/timing/severity/associated sxs/prior Treatment) HPI Andrea Wise is a 35 y.o. female medical problems, presents to emergency department complaining of ingrown toenail. Patient states that she developed pain 3 days ago and the right big toe. She states she has history of ingrown toenails. She states she normally has to cut some of the nail out for to get better. She states she trimmed the corner of her right toenail yesterday but states today the swelling and pain is getting worse. She denies any fever or chills. No injuries to the toe. No other complaints. There is no drainage from the swelling. She has not tried any medications for this.   Past Medical History  Diagnosis Date  . No pertinent past medical history    Past Surgical History  Procedure Laterality Date  . Appendectomy     Family History  Problem Relation Age of Onset  . Other Neg Hx    Social History  Substance Use Topics  . Smoking status: Never Smoker   . Smokeless tobacco: None  . Alcohol Use: No   OB History    Gravida Para Term Preterm AB TAB SAB Ectopic Multiple Living   Review of Systems  Constitutional: Negative for fever and chills.  Musculoskeletal: Positive for arthralgias.  Skin: Positive for wound.  Neurological: Negative for numbness.      Allergies  Review of patient's allergies indicates no known allergies.  Home Medications   Prior to Admission medications   Medication Sig Start Date End Date Taking? Authorizing Provider  cephALEXin (KEFLEX) 500 MG capsule Take 1 capsule (500 mg total) by mouth 4 (four) times daily. 12/31/15   Gala Padovano, PA-C  naproxen (NAPROSYN) 500 MG tablet Take 1 tablet (500 mg total) by mouth 2 (two)  times daily. 12/31/15   Eula Jaster, PA-C  ranitidine (ZANTAC) 150 MG capsule Take 1 capsule (150 mg total) by mouth daily. 08/22/14   Sofie Rower, MD   BP 117/74 mmHg  Pulse 78  Temp(Src) 97.6 F (36.4 C) (Oral)  Resp 20  SpO2 98% Physical Exam  Constitutional: She appears well-developed and well-nourished. No distress.  Eyes: Conjunctivae are normal.  Neck: Neck supple.  Musculoskeletal:       Feet:  Swelling noted to the lateral aspect of the distal right big toe. Patient already resected a wedge out of her toenail. There is no drainage from around the cuticle. It is very tender to palpation.  Neurological: She is alert.  Skin: Skin is warm and dry.  Nursing note and vitals reviewed.   ED Course  Procedures (including critical care time) Labs Review Labs Reviewed - No data to display  Imaging Review No results found. I have personally reviewed and evaluated these images and lab results as part of my medical decision-making.   EKG Interpretation None      MDM   Final diagnoses:  Ingrown toenail   Patient with swelling to the cuticle area over the big toe. She already resected half of her toenail herself. There is no toenail near the infected cuticles. This is most likely a result of ingrown toenail. There still some swelling and tenderness. No drainage. Plan to do  at home soaks, antibiotics, follow-up as needed.  Filed Vitals:   12/31/15 1315  BP: 117/74  Pulse: 78  Temp: 97.6 F (36.4 C)  TempSrc: Oral  Resp: 20  SpO2: 98%      Jaynie Crumble, PA-C 12/31/15 1417  Doug Sou, MD 12/31/15 1718

## 2020-11-08 ENCOUNTER — Emergency Department (HOSPITAL_COMMUNITY)
Admission: EM | Admit: 2020-11-08 | Discharge: 2020-11-08 | Disposition: A | Discharge status: Home / Self Care | Payer: Self-pay | Attending: Emergency Medicine | Admitting: Emergency Medicine

## 2020-11-08 ENCOUNTER — Emergency Department (HOSPITAL_COMMUNITY): Payer: Self-pay

## 2020-11-08 DIAGNOSIS — E1165 Type 2 diabetes mellitus with hyperglycemia: Secondary | ICD-10-CM | POA: Insufficient documentation

## 2020-11-08 DIAGNOSIS — Z7984 Long term (current) use of oral hypoglycemic drugs: Secondary | ICD-10-CM | POA: Insufficient documentation

## 2020-11-08 DIAGNOSIS — R0789 Other chest pain: Secondary | ICD-10-CM | POA: Insufficient documentation

## 2020-11-08 DIAGNOSIS — R739 Hyperglycemia, unspecified: Secondary | ICD-10-CM

## 2020-11-08 LAB — BASIC METABOLIC PANEL
Anion gap: 11 (ref 5–15)
BUN: 10 mg/dL (ref 6–20)
CO2: 21 mmol/L — ABNORMAL LOW (ref 22–32)
Calcium: 9.6 mg/dL (ref 8.9–10.3)
Chloride: 103 mmol/L (ref 98–111)
Creatinine, Ser: 0.54 mg/dL (ref 0.44–1.00)
GFR, Estimated: >60 mL/min (ref >60)
Glucose, Bld: 261 mg/dL — ABNORMAL HIGH (ref 70–99)
Potassium: 3.8 mmol/L (ref 3.5–5.1)
Sodium: 135 mmol/L (ref 135–145)

## 2020-11-08 LAB — CBC
HCT: 40.9 % (ref 36.0–46.0)
Hemoglobin: 13.2 g/dL (ref 12.0–15.0)
MCH: 26.5 pg (ref 26.0–34.0)
MCHC: 32.3 g/dL (ref 30.0–36.0)
MCV: 82 fL (ref 80.0–100.0)
Platelets: 236 10*3/uL (ref 150–400)
RBC: 4.99 MIL/uL (ref 3.87–5.11)
RDW: 13.2 % (ref 11.5–15.5)
WBC: 8.3 10*3/uL (ref 4.0–10.5)
nRBC: 0 % (ref 0.0–0.2)

## 2020-11-08 LAB — TROPONIN I (HIGH SENSITIVITY)
Troponin I (High Sensitivity): <2 ng/L (ref <18)
Troponin I (High Sensitivity): <2 ng/L (ref <18)

## 2020-11-08 LAB — HEPATIC FUNCTION PANEL
ALT: 53 U/L — ABNORMAL HIGH (ref 0–44)
AST: 24 U/L (ref 15–41)
Albumin: 4 g/dL (ref 3.5–5.0)
Alkaline Phosphatase: 76 U/L (ref 38–126)
Bilirubin, Direct: <0.1 mg/dL (ref 0.0–0.2)
Total Bilirubin: 0.7 mg/dL (ref 0.3–1.2)
Total Protein: 8 g/dL (ref 6.5–8.1)

## 2020-11-08 LAB — LIPASE, BLOOD: Lipase: 29 U/L (ref 11–51)

## 2020-11-08 LAB — I-STAT BETA HCG BLOOD, ED (MC, WL, AP ONLY): I-stat hCG, quantitative: <5 m[IU]/mL (ref <5)

## 2020-11-08 MED ORDER — METFORMIN HCL 500 MG PO TABS: 500.0000 mg | ORAL_TABLET | Freq: Every day | ORAL | 0 refills | Status: DC

## 2020-11-08 MED ORDER — LACTATED RINGERS IV BOLUS
1000.0000 mL | Freq: Once | INTRAVENOUS | Status: AC
  Administered 2020-11-08: 1000 mL via INTRAVENOUS

## 2020-11-08 NOTE — Discharge Instructions (Addendum)
Your medicine may be under three dollars at [facility] , or about 4 dollars at [facility] even with out good RX.

## 2020-11-08 NOTE — ED Provider Notes (Signed)
[facility] EMERGENCY DEPARTMENT Provider Note   CSN: [id number] Arrival date & time: 11/08/20  1355     History Chief Complaint  Patient presents with  . Chest Pain    Andrea Wise is a 39 y.o. female with no past medical history who presents today for evaluation of chest pain.  She reports that at about 10 this the morning she was eating oatmeal with water and had sudden onset of left-sided chest pain.  She states that the pain has not radiated or moved and feels like someone is poking her in the left side of her chest with a finger.  She reports she had a little bit of nausea however no vomiting or diarrhea.  No cough.  She feels mildly short of breath.  No history of asthma.  She denies any fevers or known sick contacts.  She denies any recent surgeries or immobilizations.  No personal history of DVT/PE.  No hemoptysis or leg swelling.  She has had similar symptoms before.  Was seen in 2015 for atypical left-sided chest pain but was felt to be related to gastritis versus esophagitis.  Given aspirin by EMS without improvement in her symptoms.  HPI performed through professional Spanish-speaking medical interpreter.   HPI     Past Medical History:  Diagnosis Date  . No pertinent past medical history     There are no problems to display for this patient.   Past Surgical History:  Procedure Laterality Date  . APPENDECTOMY       OB History    Gravida  5   Para  4   Term  3   Preterm  1   AB  1   Living  4     SAB  1   TAB      Ectopic      Multiple      Live Births  1           Family History  Problem Relation Age of Onset  . Other Neg Hx     Social History   Tobacco Use  . Smoking status: Never Smoker  Substance Use Topics  . Alcohol use: No  . Drug use: No    Home Medications Prior to Admission medications   Medication Sig Start Date End Date Taking? Authorizing Provider  cephALEXin (KEFLEX) 500 MG  capsule Take 1 capsule (500 mg total) by mouth 4 (four) times daily. 12/31/15   Kirichenko, Lemont Fillers, PA-C  metFORMIN (GLUCOPHAGE) 500 MG tablet Take 1 tablet (500 mg total) by mouth daily with breakfast. 11/08/20   Cristina Gong, PA-C  naproxen (NAPROSYN) 500 MG tablet Take 1 tablet (500 mg total) by mouth 2 (two) times daily. 12/31/15   Kirichenko, Tatyana, PA-C  ranitidine (ZANTAC) 150 MG capsule Take 1 capsule (150 mg total) by mouth daily. 08/22/14   Sofie Rower, MD    Allergies    Patient has no known allergies.  Review of Systems   Review of Systems  Constitutional: Negative for chills, fatigue and fever.  HENT: Negative for congestion.   Respiratory: Positive for shortness of breath. Negative for cough and chest tightness.   Cardiovascular: Positive for chest pain. Negative for palpitations and leg swelling.  Gastrointestinal: Positive for nausea. Negative for abdominal pain, diarrhea and vomiting.  Skin: Negative for color change and rash.  Neurological: Negative for weakness and headaches.  All other systems reviewed and are negative.   Physical Exam Updated Vital Signs BP  127/75 (BP Location: Left Arm)   Pulse 68   Temp 98.9 F (37.2 C) (Oral)   Resp 16   SpO2 100%   Physical Exam Vitals and nursing note reviewed.  Constitutional:      General: She is not in acute distress.    Appearance: She is well-developed.  HENT:     Head: Normocephalic and atraumatic.  Eyes:     Conjunctiva/sclera: Conjunctivae normal.  Neck:     Vascular: No JVD.     Trachea: No tracheal deviation.  Cardiovascular:     Rate and Rhythm: Normal rate and regular rhythm.     Heart sounds: Normal heart sounds. No murmur heard.   Pulmonary:     Effort: Pulmonary effort is normal. No respiratory distress.     Breath sounds: Normal breath sounds. No decreased breath sounds or wheezing.  Chest:     Chest wall: Tenderness (Palpation over the left anterior chest recreates and exacerbates  her pain. ) present.  Abdominal:     Palpations: Abdomen is soft.     Tenderness: There is no abdominal tenderness.  Musculoskeletal:     Cervical back: Neck supple.     Right lower leg: No tenderness. No edema.     Left lower leg: No tenderness. No edema.  Skin:    General: Skin is warm and dry.  Neurological:     General: No focal deficit present.     Mental Status: She is alert.     ED Results / Procedures / Treatments   Labs (all labs ordered are listed, but only abnormal results are displayed) Labs Reviewed  BASIC METABOLIC PANEL - Abnormal; Notable for the following components:      Result Value   CO2 21 (*)    Glucose, Bld 261 (*)    All other components within normal limits  HEPATIC FUNCTION PANEL - Abnormal; Notable for the following components:   ALT 53 (*)    All other components within normal limits  CBC  LIPASE, BLOOD  I-STAT BETA HCG BLOOD, ED ([facility], [facility], [facility] ONLY)  TROPONIN I (HIGH SENSITIVITY)  TROPONIN I (HIGH SENSITIVITY)    EKG EKG Interpretation  Date/Time:  Tuesday November 08 2020 14:02:04 EST Ventricular Rate:  76 PR Interval:  186 QRS Duration: 82 QT Interval:  384 QTC Calculation: 432 R Axis:   104 Text Interpretation: Normal sinus rhythm with sinus arrhythmia Rightward axis Borderline ECG No significant change since last tracing Confirmed by Richardean Canal [id number]) on 11/08/2020 7:47:16 PM   Radiology DG Chest 2 View  Result Date: 11/08/2020 CLINICAL DATA:  Chest pain. EXAM: CHEST - 2 VIEW COMPARISON:  August 22, 2014. FINDINGS: The heart size and mediastinal contours are within normal limits. Both lungs are clear. No pneumothorax or pleural effusion is noted. The visualized skeletal structures are unremarkable. IMPRESSION: No active cardiopulmonary disease. Electronically Signed   By: Lupita Raider M.D.   On: 11/08/2020 15:04   Orthostatic VS for the past 24 hrs:  BP- Lying Pulse- Lying BP- Sitting Pulse- Sitting BP- Standing at 0 minutes  Pulse- Standing at 0 minutes  11/08/20 2138 127/75 68 129/71 69 133/75 66     Procedures Procedures (including critical care time)  Medications Ordered in ED Medications  lactated ringers bolus 1,000 mL (1,000 mLs Intravenous New Bag/Given 11/08/20 2024)    ED Course  I have reviewed the triage vital signs and the nursing notes.  Pertinent labs & imaging results that were  available during my care of the patient were reviewed by me and considered in my medical decision making (see chart for details).    MDM Rules/Calculators/A&P                         Patient is a 39 year old woman here with left-sided chest pain that started suddenly this morning.  Her EKG is nonischemic.  Troponin x2 is negative.  She is PERC negative.  Chest x-ray is unremarkable.  BMP does show hyperglycemia with a glucose of 261.  CO2 is minimally low at 21 however I do not think that this is currently clinically relevant.  Hepatic function panel and lipase are both normal.  Pregnancy test is negative.  Here patient is afebrile not tachycardic or tachypneic and at 100% on room air.  We discussed that with her sugar being over 250 she appears to have diabetes.  She is started on Metformin.  I consulted TOC who will have wellness clinic call her to help ensure follow-up as patient does not have a primary care doctor.  Suspect musculoskeletal chest pain as her pain is recreated with palpation of the chest.  Doubt pulmonary or cardiovascular cause of her symptoms.   In the ER her hyperglycemia was treated with 1 L of IV fluids.  Return precautions were discussed with patient who states their understanding.  At the time of discharge patient denied any unaddressed complaints or concerns.  Patient is agreeable for discharge home.  Note: Portions of this report may have been transcribed using voice recognition software. Every effort was made to ensure accuracy; however, inadvertent computerized transcription errors may be  present  Final Clinical Impression(s) / ED Diagnoses Final diagnoses:  Atypical chest pain  Hyperglycemia    Rx / DC Orders ED Discharge Orders         Ordered    metFORMIN (GLUCOPHAGE) 500 MG tablet  Daily with breakfast        11/08/20 2157           Cristina Gong, PA-C 11/08/20 2251    Charlynne Pander, MD 11/09/20 [phone]

## 2020-11-08 NOTE — ED Triage Notes (Signed)
Pt here via EMS for cp radiation to L arm with associated dizziness and nausea. 324 ASA given by EMS with minimal improvement.

## 2020-11-08 NOTE — Progress Notes (Signed)
   11/08/20 2139  TOC ED Mini Assessment  TOC Time spent with patient (minutes): 15  PING Used in TOC Assessment No  Admission or Readmission Diverted Yes  Interventions which prevented an admission or readmission PCP Appointment Scheduled  What brought you to the Emergency Department?  chest pains  ED RNCM received TOC consult for assisting with ED follow up, patient is spanish speaking brought to ED by EMS with CP.  Patient was diagnosed with DM today in the ED and is uninsured. CM forwarded patient's information to the [facility] CM to reach out to patient to schedule an appointment for ED follow up and diabetes management.

## 2020-11-09 ENCOUNTER — Telehealth: Payer: Self-pay

## 2020-11-09 NOTE — Telephone Encounter (Addendum)
Message received from Michel Bickers, RN CM requesting ED follow up appointment for patient at [facility].  Call placed to patient with assistance of Spanish Interpreter # [id number]/[employer] interpre[employer].  Call placed to patient x2  # [phone], and the voicemail was not set up, unable to leave a message.

## 2020-12-15 ENCOUNTER — Telehealth: Payer: Self-pay

## 2020-12-15 NOTE — Telephone Encounter (Signed)
Attempted again to contact patient to discuss scheduling appointment at [facility].  Call placed to # [phone]  With assistance of Spanish interpreter # [id number]/[employer]. The message stated that the voicemail was not set up.  Patient had number for [facility] on her AVS if she wished to call to schedule an appointment.

## 2020-12-20 NOTE — Telephone Encounter (Signed)
Letter sent to patient requesting she contact this office to schedule an appointment

## 2020-12-28 NOTE — Telephone Encounter (Signed)
Erskine Squibb, Melody with [facility]. Pt daughter Bo Mcclintock called back w/th mother with her. Appt was made for 2/23. However mother is concerned can not pay for appt as no income. I told daughter that someone would contact for the financial aid. She says mother says if she can get a little help she will come to the appt. Daughter stated that her mom needs her to make appt, and let them know the process needed with Mikle Bosworth. Daughter states to pls call her Bo Mcclintock at [phone] as her mom will not answer the phone due to language barrier.

## 2021-01-25 ENCOUNTER — Ambulatory Visit: Payer: Self-pay | Attending: Physician Assistant | Admitting: Physician Assistant

## 2021-01-25 ENCOUNTER — Other Ambulatory Visit: Payer: Self-pay

## 2021-01-25 ENCOUNTER — Encounter: Payer: Self-pay | Admitting: Physician Assistant

## 2021-01-25 VITALS — BP 104/69 | HR 69 | Resp 16 | Ht 60.5 in | Wt 143.2 lb

## 2021-01-25 DIAGNOSIS — Z789 Other specified health status: Secondary | ICD-10-CM

## 2021-01-25 DIAGNOSIS — Z2821 Immunization not carried out because of patient refusal: Secondary | ICD-10-CM

## 2021-01-25 DIAGNOSIS — E1165 Type 2 diabetes mellitus with hyperglycemia: Secondary | ICD-10-CM

## 2021-01-25 DIAGNOSIS — Z09 Encounter for follow-up examination after completed treatment for conditions other than malignant neoplasm: Secondary | ICD-10-CM

## 2021-01-25 LAB — POCT GLYCOSYLATED HEMOGLOBIN (HGB A1C): HbA1c, POC (controlled diabetic range): 5.4 % (ref 0.0–7.0)

## 2021-01-25 LAB — GLUCOSE, POCT (MANUAL RESULT ENTRY): POC Glucose: 93 mg/dl (ref 70–99)

## 2021-01-25 MED ORDER — METFORMIN HCL 500 MG PO TABS: 500.0000 mg | ORAL_TABLET | Freq: Every day | ORAL | 1 refills | Status: DC

## 2021-01-25 NOTE — Patient Instructions (Signed)
Hiperglucemia Hyperglycemia La hiperglucemia se produce cuando el nivel de azcar (glucosa) en la sangre es demasiado alto. Un nivel alto de azcar en la sangre puede presentarse tanto en personas que tienen diabetes como en las que no la tienen. El nivel de azcar en la sangre puede elevarse rpidamente. Puede ser Ardelia Mems emergencia. Cules son las causas? Si tiene diabetes, el Science writer de azcar en la sangre puede deberse a lo siguiente:  Medicamentos que Administrator, Civil Service en la sangre o que afectan el control de la diabetes.  Disminuir la actividad fsica.  Comer en exceso.  Estar enfermo o lesionado, o tener una infeccin.  Someterse a [place].  Estrs.  No administrarse la insulina suficiente (si [place]). El nivel alto de azcar en la sangre puede deberse a que usted tiene diabetes que an no se le ha diagnosticado. Si no tiene diabetes, el nivel alto de azcar en la sangre puede deberse a lo siguiente:  Ciertos medicamentos.  Estrs.  Una enfermedad grave.  Una infeccin.  Someterse a [place].  Enfermedades del pncreas. Qu incrementa el riesgo? Es ms probable que una persona con factores de riesgo de diabetes tenga esta afeccin, por ejemplo:  Tener un familiar con diabetes.  Ciertas afecciones en las que el sistema de defensa del cuerpo (sistema inmunitario) se ataca a s mismo. Estos se conocen como trastornos autoinmunitarios.  Tener sobrepeso.  Ser sedentario.  Tener una afeccin llamada resistencia a la insulina.  Tener antecedentes de lo siguiente: ? Prediabetes. ? Diabetes durante el embarazo. ? Sndrome del ovario poliqustico (SOP). Cules son los signos o sntomas? Es posible que esta afeccin no cause sntomas. Si tiene sntomas, pueden incluir los siguientes:  Estar ms sediento que lo habitual.  Necesidad de Garment/textile technologist con mayor frecuencia que lo normal.  Hambre.  Mucho cansancio.  Visin borrosa. A medida que la afeccin empeora,  puede presentar otros sntomas, como los siguientes:  Sequedad en la boca.  Dolor en el vientre (abdomen).  Falta de hambre (inapetencia).  Aliento con USAA a fruta.  Debilidad.  Prdida de peso no planificada.  Hormigueo o adormecimiento en las manos o los pies.  Dolor de [place].  Cortes o moretones que tardan en curarse. Cmo se trata? El tratamiento depende de la causa de la afeccin. El tratamiento puede incluir:  Tomar medicamentos para Chief Technology Officer los niveles de Dispensing optician.  Cambiar los medicamentos o la dosis si recibe insulina u otros medicamentos para la diabetes.  Cambios en el estilo de vida. Pueden incluir: ? Hacer ms ejercicio. ? Consumir alimentos ms saludables. ? [employer]el.  Tratar una enfermedad o afeccin.  Controlarse el nivel de azcar en la sangre con mayor frecuencia.  Suspender o reducir los medicamentos con corticoesteroides. Si la afeccin es muy grave, deber recibir tratamiento en el hospital. Siga estas instrucciones en su casa: Instrucciones generales  Use los medicamentos de venta libre y los recetados solamente como se lo haya indicado el mdico.  No fume ni consuma ningn producto que contenga nicotina o tabaco. Si necesita ayuda para dejar de fumar, consulte al mdico.  Si bebe alcohol: ? Limite la cantidad que bebe a lo siguiente:  De 0 a 1 medida por da para las mujeres que no estn embarazadas.  De 0 a 2 medidas por da para los hombres. ? Sepa cunta cantidad de alcohol hay en las bebidas. En los [place], una medida equivale a una botella de cerveza de 12oz (334ml), un  vaso de vino de 5oz ( ) o un vaso de una bebida alcohlica de alta graduacin de 1oz (26ml).  Controle el estrs. Si necesita ayuda para lograrlo, consulte al mdico.  Haga los ejercicios como se lo haya indicado el mdico.  Cumpla con todas las visitas de seguimiento. Comida y bebida  Mantenga un peso saludable.  Asegrese  de beber suficiente lquido cuando: ? Actividad fsica. ? Se enferma. ? El clima es caluroso.  Beba suficiente lquido para Radio producer pis (orina) de color amarillo plido.   Si usted tiene diabetes:  Conozca los sntomas de nivel alto de Banker.  Siga el plan de control de la diabetes como se lo haya indicado el mdico. Asegrese de hacer lo siguiente: ? Aplquese la insulina y tome los medicamentos como se lo hayan indicado. ? Siga su plan de ejercicio. ? Siga su plan de comidas. Coma a horario. No omita comidas. ? Contrlese el nivel de azcar en la sangre con la frecuencia que le hayan indicado. Contrlese antes y despus de Materials engineer. Si hace ejercicio durante ms tiempo o de Abbott Laboratories, contrlese el azcar en la sangre con mayor frecuencia. ? Siga el plan para los [address] de enfermedad cuando no pueda comer ni beber normalmente. Elabore este plan de antemano con el mdico.  Comparta su plan de control de la diabetes con sus compaeros de trabajo y de Production designer, theatre/television/film, y con las personas con las que [place].  Contrlese las cetonas en la orina cuando est enfermo y [place] se lo haya indicado el mdico.  Lleve consigo una tarjeta, o use una medalla o un brazalete que indique que tiene diabetes.   Dnde buscar ms informacin American Diabetes Association (Asociacin Estadounidense de la Diabetes): www.diabetes.org Comunquese con un mdico si:  El nivel de azcar en la sangre es igual o mayor que 240mg /dl ( ) durante 2das seguidos.  Tiene problemas para 80.3OZYY/QM de azcar en la sangre dentro del intervalo deseado.  Tiene presin arterial alta con frecuencia.  Tiene signos de enfermedad, por ejemplo: ? Sensacin de que va a vomitar (nuseas). ? Vmitos. ? Fiebre. Solicite ayuda de inmediato si:  El monitor de azcar en la sangre indica un nivel "alto" incluso cuando se est administrando insulina.  Tiene dificultad para  respirar.  Tiene cambios en la manera de sentirse, pensar o actuar (estado mental).  Siente ganas de vomitar y la sensacin no desaparece.  No puede dejar de vomitar. Estos sntomas pueden Futures trader. Solicite atencin mdica de inmediato. Comunquese con el servicio de emergencias de su localidad (911 en los [place]).  No espere a ver si los sntomas desaparecen.  No conduzca por sus propios medios Customer service manager hospital. Resumen  La hiperglucemia se produce cuando el nivel de azcar (glucosa) en la sangre es demasiado alto.  Un nivel alto de azcar en la sangre puede presentarse tanto en personas que tienen diabetes como en las que no la tienen.  Asegrese de beber una cantidad suficiente de lquidos y de seguir su plan de comidas. Ejerctese con la frecuencia que le haya indicado el mdico.  Comunquese con el mdico si tiene problemas para [employer] de azcar en la sangre dentro del intervalo deseado. Esta informacin no tiene Futures trader el consejo del mdico. Asegrese de hacerle al mdico cualquier pregunta que tenga. Document Revised: 09/20/2020 Document Reviewed: 09/20/2020 Elsevier Patient Education  2021 2022.

## 2021-01-25 NOTE — Progress Notes (Signed)
Patient ID: Andrea Wise, female   DOB: July 11, 1981, 40 y.o.   MRN: [id number]     Andrea Wise, is a 40 y.o. female  WFU:[id number]  KGU:[id number]  DOB - 09/09/1981  Subjective:  Chief Complaint and HPI: Andrea Wise is a 40 y.o. female here today to establish care and for a follow up visit  after ED visit for CP 11/08/2021 and found to have hyperglycemia and started on metformin.  She is doing well.  No other health problems.  She has been compliant on metformin but did run out a few days ago.  Her daughter is with her today translating.  No further episodes of CP.    From HPI Andrea Wise is a 40 y.o. female with no past medical history who presents today for evaluation of chest pain.  She reports that at about 10 this the morning she was eating oatmeal with water and had sudden onset of left-sided chest pain.  She states that the pain has not radiated or moved and feels like someone is poking her in the left side of her chest with a finger.  She reports she had a little bit of nausea however no vomiting or diarrhea.  No cough.  She feels mildly short of breath.  No history of asthma.  She denies any fevers or known sick contacts.  She denies any recent surgeries or immobilizations.  No personal history of DVT/PE.  No hemoptysis or leg swelling.  She has had similar symptoms before.  Was seen in 2015 for atypical left-sided chest pain but was felt to be related to gastritis versus esophagitis  From A/P Patient is a 40 year old woman here with left-sided chest pain that started suddenly this morning.  Her EKG is nonischemic.  Troponin x2 is negative.  She is PERC negative.  Chest x-ray is unremarkable.  BMP does show hyperglycemia with a glucose of 261.  CO2 is minimally low at 21 however I do not think that this is currently clinically relevant.  Hepatic function panel and lipase are both normal.  Pregnancy test is negative.  Here patient is afebrile  not tachycardic or tachypneic and at 100% on room air.  We discussed that with her sugar being over 250 she appears to have diabetes.  She is started on Metformin.  I consulted TOC who will have wellness clinic call her to help ensure follow-up as patient does not have a primary care doctor.  Suspect musculoskeletal chest pain as her pain is recreated with palpation of the chest.  Doubt pulmonary or cardiovascular cause of her symptoms.   In the ER her hyperglycemia was treated with 1 L of IV fluids.  Return precautions were discussed with patient who states their understanding.  At the time of discharge patient denied any unaddressed complaints or concerns.  Patient is agreeable for discharge home.   ED/Hospital notes reviewed.    ROS:   Constitutional:  No f/c, No night sweats, No unexplained weight loss. EENT:  No vision changes, No blurry vision, No hearing changes. No mouth, throat, or ear problems.  Respiratory: No cough, No SOB Cardiac: No CP, no palpitations GI:  No abd pain, No N/V/D. GU: No Urinary s/sx Musculoskeletal: No joint pain Neuro: No headache, no dizziness, no motor weakness.  Skin: No rash Endocrine:  No polydipsia. No polyuria.  Psych: Denies SI/HI  No problems updated.  ALLERGIES: No Known Allergies  PAST MEDICAL HISTORY: Past Medical History:  Diagnosis Date  . No pertinent  past medical history     MEDICATIONS AT HOME: Prior to Admission medications   Medication Sig Start Date End Date Taking? Authorizing Provider  metFORMIN (GLUCOPHAGE) 500 MG tablet Take 1 tablet (500 mg total) by mouth daily with breakfast. 01/25/21   Anders Simmonds, PA-C     Objective:  EXAM:   Vitals:   01/25/21 0846  BP: 104/69  Pulse: 69  Resp: 16  SpO2: 98%  Weight: 143 lb 3.2 oz (65 kg)  Height: 5' 0.5" (1.537 m)    General appearance : A&OX3. NAD. Non-toxic-appearing HEENT: Atraumatic and Normocephalic.  PERRLA. EOM intact.  Chest/Lungs:   Breathing-non-labored, Good air entry bilaterally, breath sounds normal without rales, rhonchi, or wheezing  CVS: S1 S2 regular, no murmurs, gallops, rubs  Extremities: Bilateral Lower Ext shows no edema, both legs are warm to touch with = pulse throughout Neurology:  CN II-XII grossly intact, Non focal.   Psych:  TP linear. J/I WNL. Normal speech. Appropriate eye contact and affect.  Skin:  No Rash  Data Review Lab Results  Component Value Date   HGBA1C 5.4 01/25/2021     Assessment & Plan   1. Type 2 diabetes mellitus with hyperglycemia, without long-term current use of insulin (HCC) I have had a lengthy discussion and provided education about insulin resistance and the intake of too much sugar/refined carbohydrates.  I have advised the patient to work at a goal of eliminating sugary drinks, candy, desserts, sweets, refined sugars, processed foods, and white carbohydrates.  The patient expresses understanding.  - Glucose (CBG) - HgB A1c - metFORMIN (GLUCOPHAGE) 500 MG tablet; Take 1 tablet (500 mg total) by mouth daily with breakfast.  Dispense: 90 tablet; Refill: 1 - Comprehensive metabolic panel - Lipid panel  2. Influenza vaccine refused  3. Language barrier AMN refused.  Daughter translated  4. Encounter for examination following treatment at hospital Doing well     Patient have been counseled extensively about nutrition and exercise  Return in about 3 months (around 04/24/2021) for assign PCP.  The patient was given clear instructions to go to ER or return to medical center if symptoms don't improve, worsen or new problems develop. The patient verbalized understanding. The patient was told to call to get lab results if they haven't heard anything in the next week.     Georgian Co, PA-C [facility] and [facility] [place], [place] [phone]   01/25/2021, 9:06 AM

## 2021-01-26 LAB — LIPID PANEL
Chol/HDL Ratio: 3.4 ratio (ref 0.0–4.4)
Cholesterol, Total: 153 mg/dL (ref 100–199)
HDL: 45 mg/dL (ref >39)
LDL Chol Calc (NIH): 95 mg/dL (ref 0–99)
Triglycerides: 66 mg/dL (ref 0–149)
VLDL Cholesterol Cal: 13 mg/dL (ref 5–40)

## 2021-01-26 LAB — COMPREHENSIVE METABOLIC PANEL
ALT: 24 IU/L (ref 0–32)
AST: 13 IU/L (ref 0–40)
Albumin/Globulin Ratio: 1.9 (ref 1.2–2.2)
Albumin: 4.5 g/dL (ref 3.8–4.8)
Alkaline Phosphatase: 80 IU/L (ref 44–121)
BUN/Creatinine Ratio: 25 — ABNORMAL HIGH (ref 9–23)
BUN: 14 mg/dL (ref 6–20)
Bilirubin Total: 0.4 mg/dL (ref 0.0–1.2)
CO2: 19 mmol/L — ABNORMAL LOW (ref 20–29)
Calcium: 9.4 mg/dL (ref 8.7–10.2)
Chloride: 103 mmol/L (ref 96–106)
Creatinine, Ser: 0.57 mg/dL (ref 0.57–1.00)
GFR calc Af Amer: 136 mL/min/{1.73_m2} (ref >59)
GFR calc non Af Amer: 118 mL/min/{1.73_m2} (ref >59)
Globulin, Total: 2.4 g/dL (ref 1.5–4.5)
Glucose: 82 mg/dL (ref 65–99)
Potassium: 4.3 mmol/L (ref 3.5–5.2)
Sodium: 139 mmol/L (ref 134–144)
Total Protein: 6.9 g/dL (ref 6.0–8.5)

## 2021-01-27 ENCOUNTER — Telehealth: Payer: Self-pay

## 2021-01-27 NOTE — Telephone Encounter (Signed)
Pt was called and informed of lab results. 

## 2021-01-27 NOTE — Telephone Encounter (Signed)
Copied from CRM [id number]. Topic: General - Other >> Jan 27, 2021  1:51 PM Marylen Ponto wrote: Reason for CRM: Pt daughter called for pt most recent lab results

## 2021-01-30 ENCOUNTER — Encounter: Payer: Self-pay | Admitting: *Deleted

## 2021-02-11 ENCOUNTER — Emergency Department (HOSPITAL_COMMUNITY)
Admission: EM | Admit: 2021-02-11 | Discharge: 2021-02-12 | Disposition: A | Discharge status: Home / Self Care | Payer: Self-pay | Attending: Emergency Medicine | Admitting: Emergency Medicine

## 2021-02-11 ENCOUNTER — Other Ambulatory Visit: Payer: Self-pay

## 2021-02-11 DIAGNOSIS — R1013 Epigastric pain: Secondary | ICD-10-CM | POA: Insufficient documentation

## 2021-02-11 DIAGNOSIS — R1012 Left upper quadrant pain: Secondary | ICD-10-CM | POA: Insufficient documentation

## 2021-02-11 DIAGNOSIS — E119 Type 2 diabetes mellitus without complications: Secondary | ICD-10-CM | POA: Insufficient documentation

## 2021-02-11 DIAGNOSIS — Z7984 Long term (current) use of oral hypoglycemic drugs: Secondary | ICD-10-CM | POA: Insufficient documentation

## 2021-02-11 LAB — URINALYSIS, ROUTINE W REFLEX MICROSCOPIC
Bilirubin Urine: NEGATIVE
Glucose, UA: NEGATIVE mg/dL
Hgb urine dipstick: NEGATIVE
Ketones, ur: 20 mg/dL — AB
Nitrite: NEGATIVE
Protein, ur: NEGATIVE mg/dL
Specific Gravity, Urine: 1.024 (ref 1.005–1.030)
pH: 5 (ref 5.0–8.0)

## 2021-02-11 LAB — COMPREHENSIVE METABOLIC PANEL
ALT: 23 U/L (ref 0–44)
AST: 20 U/L (ref 15–41)
Albumin: 4 g/dL (ref 3.5–5.0)
Alkaline Phosphatase: 72 U/L (ref 38–126)
Anion gap: 9 (ref 5–15)
BUN: 18 mg/dL (ref 6–20)
CO2: 24 mmol/L (ref 22–32)
Calcium: 9.5 mg/dL (ref 8.9–10.3)
Chloride: 100 mmol/L (ref 98–111)
Creatinine, Ser: 0.77 mg/dL (ref 0.44–1.00)
GFR, Estimated: >60 mL/min (ref >60)
Glucose, Bld: 96 mg/dL (ref 70–99)
Potassium: 4.2 mmol/L (ref 3.5–5.1)
Sodium: 133 mmol/L — ABNORMAL LOW (ref 135–145)
Total Bilirubin: 0.9 mg/dL (ref 0.3–1.2)
Total Protein: 7.2 g/dL (ref 6.5–8.1)

## 2021-02-11 LAB — CBC
HCT: 40.5 % (ref 36.0–46.0)
Hemoglobin: 13.1 g/dL (ref 12.0–15.0)
MCH: 27.1 pg (ref 26.0–34.0)
MCHC: 32.3 g/dL (ref 30.0–36.0)
MCV: 83.7 fL (ref 80.0–100.0)
Platelets: 220 10*3/uL (ref 150–400)
RBC: 4.84 MIL/uL (ref 3.87–5.11)
RDW: 14.4 % (ref 11.5–15.5)
WBC: 7.6 10*3/uL (ref 4.0–10.5)
nRBC: 0 % (ref 0.0–0.2)

## 2021-02-11 LAB — I-STAT BETA HCG BLOOD, ED (MC, WL, AP ONLY): I-stat hCG, quantitative: <5 m[IU]/mL (ref <5)

## 2021-02-11 LAB — LIPASE, BLOOD: Lipase: 39 U/L (ref 11–51)

## 2021-02-11 MED ORDER — ALUM & MAG HYDROXIDE-SIMETH 200-200-20 MG/5ML PO SUSP
30.0000 mL | Freq: Once | ORAL | Status: AC
  Administered 2021-02-11: 30 mL via ORAL
  Filled 2021-02-11: qty 30

## 2021-02-11 NOTE — ED Triage Notes (Signed)
Triage done using Spanish interpretor  Pt presents to ED POV. PT c/o LUQ abd pain x3d. Pt denies n/v/d. Pain described as sharp. LMP 01/07/2021 but has hx of irregular periods

## 2021-02-11 NOTE — ED Provider Notes (Signed)
[facility] EMERGENCY DEPARTMENT Provider Note   CSN: [id number] Arrival date & time: 02/11/21  1854     History Chief Complaint  Patient presents with  . Abdominal Pain    Andrea Wise is a 40 y.o. female.  HPI     This 40 year old female with a history of type 2 diabetes who presents with abdominal pain.  Patient reports 3-day history of epigastric and left upper quadrant abdominal pain.  She describes it as sharp.  At times it radiates to her back and her shoulder.  It seems to come and go.  It is currently 6 out of 10.  She is not taking anything for the pain.  Nothing seems to make the pain better or worse although she states sometimes "certain foods I do or do not want to eat."  She is not had any fevers.  No known sick contacts.  Denies nausea, vomiting, diarrhea, urinary symptoms.  History was taken with the aid of an interpreter.  Past Medical History:  Diagnosis Date  . No pertinent past medical history     Patient Active Problem List   Diagnosis Date Noted  . Influenza vaccine refused 01/25/2021  . Type 2 diabetes mellitus with hyperglycemia, without long-term current use of insulin (HCC) 01/25/2021    Past Surgical History:  Procedure Laterality Date  . APPENDECTOMY       OB History    Gravida  5   Para  4   Term  3   Preterm  1   AB  1   Living  4     SAB  1   IAB      Ectopic      Multiple      Live Births  1           Family History  Problem Relation Age of Onset  . Other Neg Hx     Social History   Tobacco Use  . Smoking status: Never Smoker  Substance Use Topics  . Alcohol use: No  . Drug use: No    Home Medications Prior to Admission medications   Medication Sig Start Date End Date Taking? Authorizing Provider  omeprazole (PRILOSEC) 20 MG capsule Take 1 capsule (20 mg total) by mouth daily. 02/12/21  Yes Horton, Mayer Masker, MD  metFORMIN (GLUCOPHAGE) 500 MG tablet Take 1 tablet (500  mg total) by mouth daily with breakfast. 01/25/21   Anders Simmonds, PA-C    Allergies    Patient has no known allergies.  Review of Systems   Review of Systems  Constitutional: Negative for fever.  Respiratory: Negative for shortness of breath.   Cardiovascular: Negative for chest pain.  Gastrointestinal: Positive for abdominal pain. Negative for diarrhea, nausea and vomiting.  Genitourinary: Negative for dysuria.  All other systems reviewed and are negative.   Physical Exam Updated Vital Signs BP 114/68 (BP Location: Right Arm)   Pulse (!) 59   Temp 98.5 F (36.9 C) (Oral)   Resp 15   SpO2 100%   Physical Exam Vitals and nursing note reviewed.  Constitutional:      Appearance: She is well-developed. She is not ill-appearing.  HENT:     Head: Normocephalic and atraumatic.  Eyes:     Pupils: Pupils are equal, round, and reactive to light.  Cardiovascular:     Rate and Rhythm: Normal rate and regular rhythm.     Heart sounds: Normal heart sounds.  Pulmonary:  Effort: Pulmonary effort is normal. No respiratory distress.     Breath sounds: No wheezing.  Abdominal:     General: Bowel sounds are normal.     Palpations: Abdomen is soft.     Tenderness: There is abdominal tenderness in the epigastric area. There is rebound. There is no guarding.  Musculoskeletal:     Cervical back: Neck supple.  Skin:    General: Skin is warm and dry.  Neurological:     Mental Status: She is alert and oriented to person, place, and time.  Psychiatric:        Mood and Affect: Mood normal.     ED Results / Procedures / Treatments   Labs (all labs ordered are listed, but only abnormal results are displayed) Labs Reviewed  COMPREHENSIVE METABOLIC PANEL - Abnormal; Notable for the following components:      Result Value   Sodium 133 (*)    All other components within normal limits  URINALYSIS, ROUTINE W REFLEX MICROSCOPIC - Abnormal; Notable for the following components:    APPearance HAZY (*)    Ketones, ur 20 (*)    Leukocytes,Ua TRACE (*)    Bacteria, UA RARE (*)    All other components within normal limits  LIPASE, BLOOD  CBC  I-STAT BETA HCG BLOOD, ED ([facility], [facility], [facility] ONLY)    EKG None  Radiology No results found.  Procedures Procedures   Medications Ordered in ED Medications  alum & mag hydroxide-simeth (MAALOX/MYLANTA) 200-200-20 MG/5ML suspension 30 mL (30 mLs Oral Given 02/11/21 2342)    ED Course  I have reviewed the triage vital signs and the nursing notes.  Pertinent labs & imaging results that were available during my care of the patient were reviewed by me and considered in my medical decision making (see chart for details).    MDM Rules/Calculators/A&P                          Patient presents with abdominal pain.  Overall nontoxic-appearing vital signs are reassuring.  She has some mild epigastric tenderness on exam.  Considerations include but not limited to, reflux, peptic ulcer, pancreatitis, less likely cholecystitis given that it radiates more to the left upper quadrant.  Less likely diverticulitis or colitis.  Labs obtained and reviewed.  No leukocytosis.  No significant metabolic derangements.  Normal LFTs and lipase which would argue against pancreatitis or cholecystitis.  Patient was given a GI cocktail.  She was able to p.o. challenge.  Do not feel she seems to imaging at this time.  Will trial with omeprazole to see if this improves her symptoms.  She was given strict return precautions.  After history, exam, and medical workup I feel the patient has been appropriately medically screened and is safe for discharge home. Pertinent diagnoses were discussed with the patient. Patient was given return precautions.  Final Clinical Impression(s) / ED Diagnoses Final diagnoses:  Epigastric pain    Rx / DC Orders ED Discharge Orders         Ordered    omeprazole (PRILOSEC) 20 MG capsule  Daily        02/12/21 0050            Shon Baton, MD 02/12/21 [phone]

## 2021-02-12 MED ORDER — OMEPRAZOLE 20 MG PO CPDR: 20.0000 mg | DELAYED_RELEASE_CAPSULE | Freq: Every day | ORAL | 0 refills | Status: AC

## 2021-02-12 NOTE — Discharge Instructions (Addendum)
You were seen today for abdominal pain.  Your work-up is reassuring.  Start omeprazole to see if this helps.  If not improving, follow-up closely with your primary provider.  If you develop new or worsening symptoms, you should be reevaluated immediately.

## 2021-02-12 NOTE — ED Notes (Signed)
Pt given water and crackers for PO challenge

## 2021-02-24 ENCOUNTER — Ambulatory Visit: Payer: Self-pay | Admitting: *Deleted

## 2021-02-24 NOTE — Telephone Encounter (Signed)
Pt called in using a Spanish interpreter.   She is c/o a headache that started yesterday.   She has type II diabetes but does not have a glucose meter to check her sugars.   She denies history of migraines and no injuries to her head.   No other symptoms.  "I'm just worried about my health".   "This is the first headache I've had".   She had not taken any Tylenol when I asked her.   I suggested she could take Tylenol for the headache.  She asked about buying a glucose meter and how would she know what her sugar levels are supposed to be.   I let her know that would need to be discussed during her office visit.   She would be taught how to check her sugar and what the levels are supposed to be.   Also how to take any diabetes medication that may be prescribed.  She has an appt coming up on 04/05/2021 with Dola Argyle at [facility].   I instructed her to go to the urgent care center if she felt she needed to be evaluated before her appt.    I also sent a note to [facility] letting them be aware of her call in case she could be worked in sooner.    Reason for Disposition . Headache is a chronic symptom (recurrent or ongoing AND present > 4 weeks)  Answer Assessment - Initial Assessment Questions 1. LOCATION: "Where does it hurt?"      Pt called in with a Spanish interpreter.  Since yesterday I'm having a headache.   I have Type II diabetes.   I'm worried about my health.   2. ONSET: "When did the headache start?" (Minutes, hours or days)      Since yesterday It makes me feel worried.   I don't have a machine to check my blood sugar. 3. PATTERN: "Does the pain come and go, or has it been constant since it started?"     This is the first headache I've had.  Had it since yesterday. 4. SEVERITY: "How bad is the pain?" and "What does it keep you from doing?"  (e.g., Scale 1-10; mild, moderate, or severe)   - MILD (1-3): doesn't interfere with normal  activities    - MODERATE (4-7): interferes with normal activities or awakens from sleep    - SEVERE (8-10): excruciating pain, unable to do any normal activities        I have not taken any Tylenol.   Today the headache is better than last night.   5. RECURRENT SYMPTOM: "Have you ever had headaches before?" If Yes, ask: "When was the last time?" and "What happened that time?"      No 6. CAUSE: "What do you think is causing the headache?"     Maybe my diabetes but I don't really know. 7. MIGRAINE: "Have you been diagnosed with migraine headaches?" If Yes, ask: "Is this headache similar?"      I can go get blood sugar machine.  How do I know what my blood sugar levels should be? 8. HEAD INJURY: "Has there been any recent injury to the head?"      No migraines or injuries.   9. OTHER SYMPTOMS: "Do you have any other symptoms?" (fever, stiff neck, eye pain, sore throat, cold symptoms)     No runny nose or sinus issues. 10. PREGNANCY: "Is there any chance  you are pregnant?" "When was your last menstrual period?"       Not asked  Protocols used: HEADACHE-A-AH

## 2021-02-28 NOTE — Telephone Encounter (Signed)
Please reach out to patient and refer her to mobile unit or make sooner appointment with another PCP beside Marylene Land.

## 2021-03-16 ENCOUNTER — Emergency Department (HOSPITAL_COMMUNITY)
Admission: EM | Admit: 2021-03-16 | Discharge: 2021-03-16 | Disposition: A | Discharge status: Home / Self Care | Payer: Self-pay | Attending: Emergency Medicine | Admitting: Emergency Medicine

## 2021-03-16 ENCOUNTER — Other Ambulatory Visit: Payer: Self-pay

## 2021-03-16 ENCOUNTER — Encounter (HOSPITAL_COMMUNITY): Payer: Self-pay | Admitting: Emergency Medicine

## 2021-03-16 ENCOUNTER — Emergency Department (HOSPITAL_COMMUNITY): Payer: Self-pay

## 2021-03-16 DIAGNOSIS — R110 Nausea: Secondary | ICD-10-CM | POA: Insufficient documentation

## 2021-03-16 DIAGNOSIS — Z7984 Long term (current) use of oral hypoglycemic drugs: Secondary | ICD-10-CM | POA: Insufficient documentation

## 2021-03-16 DIAGNOSIS — H9311 Tinnitus, right ear: Secondary | ICD-10-CM | POA: Insufficient documentation

## 2021-03-16 DIAGNOSIS — H5319 Other subjective visual disturbances: Secondary | ICD-10-CM | POA: Insufficient documentation

## 2021-03-16 DIAGNOSIS — R519 Headache, unspecified: Secondary | ICD-10-CM | POA: Insufficient documentation

## 2021-03-16 DIAGNOSIS — E119 Type 2 diabetes mellitus without complications: Secondary | ICD-10-CM | POA: Insufficient documentation

## 2021-03-16 HISTORY — DX: Type 2 diabetes mellitus without complications: E11.9

## 2021-03-16 MED ORDER — ACETAMINOPHEN 325 MG PO TABS
650.0000 mg | ORAL_TABLET | Freq: Once | ORAL | Status: AC
  Administered 2021-03-16: 650 mg via ORAL
  Filled 2021-03-16: qty 2

## 2021-03-16 MED ORDER — DIPHENHYDRAMINE HCL 25 MG PO CAPS
25.0000 mg | ORAL_CAPSULE | Freq: Once | ORAL | Status: AC
  Administered 2021-03-16: 25 mg via ORAL
  Filled 2021-03-16: qty 1

## 2021-03-16 MED ORDER — IBUPROFEN 400 MG PO TABS
600.0000 mg | ORAL_TABLET | Freq: Once | ORAL | Status: AC
  Administered 2021-03-16: 600 mg via ORAL
  Filled 2021-03-16: qty 1

## 2021-03-16 MED ORDER — PROCHLORPERAZINE MALEATE 5 MG PO TABS
10.0000 mg | ORAL_TABLET | Freq: Once | ORAL | Status: AC
  Administered 2021-03-16: 10 mg via ORAL
  Filled 2021-03-16: qty 2

## 2021-03-16 NOTE — ED Notes (Signed)
Pt to CT

## 2021-03-16 NOTE — Discharge Instructions (Addendum)
I suspect you are suffering from a migraine.  Recommend over-the-counter pain medications like ibuprofen or Tylenol every 6 hours as needed please call dosing back of bottle.  Like you to follow-up with neurology or your PCP for further evaluation.  Come back to the emergency department if you develop chest pain, shortness of breath, severe abdominal pain, uncontrolled nausea, vomiting, diarrhea.

## 2021-03-16 NOTE — ED Notes (Signed)
Patient verbalizes understanding of discharge instructions. Opportunity for questioning and answers were provided. Armband removed by staff, pt discharged from ED ambulatory.   

## 2021-03-16 NOTE — ED Provider Notes (Signed)
[facility] EMERGENCY DEPARTMENT Provider Note   CSN: [id number] Arrival date & time: 03/16/21  0407     History Chief Complaint  Patient presents with  . Headache    Andrea Wise is a 40 y.o. female.  HPI   HPI was collected using a Spanish interpreter  Patient with significant medical history of diabetes type 2 presents with chief complaint of headache.  She endorses headaches been going on for last 3 days, describes it as a right-sided headache, starts in the back of her head and  radiates to the front of her head, has associated tinnitus in the right ear, photophobia, nausea, denies lightheadedness, dizziness, paresthesia or weakness in her upper or lower extremities.  Patient denies recent head trauma, is not on anticoagulant, denies associated symptoms like fevers or chills, denies IV drug use or neck pain.  Patient has no history of migraines, she states she had a headache like this 3 weeks ago and it eventually went away on its own, patient has not tried any over-the-counter pain medications.  Patient denies  alleviating factors.  Patient denies fevers, chills, shortness breath, chest pain, abdominal pain, vomiting, diarrhea, worsening pedal edema.  Past Medical History:  Diagnosis Date  . Diabetes mellitus without complication (HCC)   . No pertinent past medical history     Patient Active Problem List   Diagnosis Date Noted  . Influenza vaccine refused 01/25/2021  . Type 2 diabetes mellitus with hyperglycemia, without long-term current use of insulin (HCC) 01/25/2021    Past Surgical History:  Procedure Laterality Date  . APPENDECTOMY       OB History    Gravida  5   Para  4   Term  3   Preterm  1   AB  1   Living  4     SAB  1   IAB      Ectopic      Multiple      Live Births  1           Family History  Problem Relation Age of Onset  . Other Neg Hx     Social History   Tobacco Use  . Smoking status:  Never Smoker  . Smokeless tobacco: Never Used  Substance Use Topics  . Alcohol use: No  . Drug use: No    Home Medications Prior to Admission medications   Medication Sig Start Date End Date Taking? Authorizing Provider  metFORMIN (GLUCOPHAGE) 500 MG tablet Take 1 tablet (500 mg total) by mouth daily with breakfast. 01/25/21   Anders Simmonds, PA-C  omeprazole (PRILOSEC) 20 MG capsule Take 1 capsule (20 mg total) by mouth daily. 02/12/21   Horton, Mayer Masker, MD    Allergies    Patient has no known allergies.  Review of Systems   Review of Systems  Constitutional: Negative for chills and fever.  HENT: Negative for congestion.   Respiratory: Negative for shortness of breath.   Cardiovascular: Negative for chest pain.  Gastrointestinal: Positive for nausea. Negative for abdominal pain, diarrhea and vomiting.  Genitourinary: Negative for enuresis.  Musculoskeletal: Negative for back pain.  Skin: Negative for rash.  Neurological: Positive for headaches. Negative for dizziness.  Hematological: Does not bruise/bleed easily.    Physical Exam Updated Vital Signs BP 102/64   Pulse 69   Temp 97.8 F (36.6 C) (Oral)   Resp 17   Ht 5\' 1"  (1.549 m)   Wt 62 kg  SpO2 99%   BMI 25.83 kg/m   Physical Exam Vitals and nursing note reviewed.  Constitutional:      General: She is not in acute distress.    Appearance: She is not ill-appearing.  HENT:     Head: Normocephalic and atraumatic.     Nose: No congestion.  Eyes:     Extraocular Movements: Extraocular movements intact.     Conjunctiva/sclera: Conjunctivae normal.     Pupils: Pupils are equal, round, and reactive to light.  Cardiovascular:     Rate and Rhythm: Normal rate and regular rhythm.     Pulses: Normal pulses.     Heart sounds: No murmur heard. No friction rub. No gallop.   Pulmonary:     Effort: No respiratory distress.     Breath sounds: No wheezing, rhonchi or rales.  Abdominal:     Palpations: Abdomen is  soft.     Tenderness: There is no abdominal tenderness.  Musculoskeletal:     Cervical back: No rigidity.     Right lower leg: No edema.     Left lower leg: No edema.  Skin:    General: Skin is warm and dry.  Neurological:     Mental Status: She is alert.     GCS: GCS eye subscore is 4. GCS verbal subscore is 5. GCS motor subscore is 6.     Cranial Nerves: No facial asymmetry.     Motor: No weakness.     Coordination: Romberg sign negative. Finger-Nose-Finger Test normal.     Comments: Cranial nerves II through XII grossly intact  Patient no difficulty with word finding.  Psychiatric:        Mood and Affect: Mood normal.     ED Results / Procedures / Treatments   Labs (all labs ordered are listed, but only abnormal results are displayed) Labs Reviewed - No data to display  EKG None  Radiology CT Head Wo Contrast  Result Date: 03/16/2021 CLINICAL DATA:  40 year old female with headache. EXAM: CT HEAD WITHOUT CONTRAST TECHNIQUE: Contiguous axial images were obtained from the base of the skull through the vertex without intravenous contrast. COMPARISON:  Head CT 08/05/2009. FINDINGS: Minimal motion artifact today. Brain: Cerebral volume is stable since 2010 and within normal limits. No midline shift, ventriculomegaly, mass effect, evidence of mass lesion, intracranial hemorrhage or evidence of cortically based acute infarction. Gray-white matter differentiation is within normal limits throughout the brain. Vascular: No suspicious intracranial vascular hyperdensity. Skull: Stable, negative. Sinuses/Orbits: Visualized paranasal sinuses and mastoids are stable and well aerated. Other: Visualized orbits and scalp soft tissues are within normal limits. IMPRESSION: Stable and normal noncontrast Head CT. Electronically Signed   By: Odessa Fleming M.D.   On: 03/16/2021 05:59    Procedures Procedures   Medications Ordered in ED Medications  prochlorperazine (COMPAZINE) tablet 10 mg (has no  administration in time range)  diphenhydrAMINE (BENADRYL) capsule 25 mg (has no administration in time range)  acetaminophen (TYLENOL) tablet 650 mg (has no administration in time range)  ibuprofen (ADVIL) tablet 600 mg (600 mg Oral Given 03/16/21 0428)    ED Course  I have reviewed the triage vital signs and the nursing notes.  Pertinent labs & imaging results that were available during my care of the patient were reviewed by me and considered in my medical decision making (see chart for details).    MDM Rules/Calculators/A&P  Initial impression-patient presents with headache x3 days.  She is alert, does not appear to be in acute distress, vital signs reassuring.  Will obtain CT head for further evaluation, suspect this is a migraine, will provide patient a migraine cocktail and reassess.  Work-up-CT head negative for acute findings.  Reassessment patient was reassessed she was provided ibuprofen and states she feels much better.  Vital signs are remained stable.  Patient is agreeable for discharge at this time.  Rule out low suspicion for systemic infection as patient is nontoxic-appearing, vital signs reassuring, no obvious source of infection noted exam.  Low suspicion for CVA, intracranial head bleed or mass as there is no neuro deficits on my exam, CT head negative for acute findings.  Low suspicion for meningitis as there is no meningeal sign, no signs of infection, patient has no IV drug history.   Plan-suspect patient some from a migraine.  Will recommend over-the-counter pain medications follow-up with neurology for further recommendations.  Vital signs have remained stable, no indication for hospital admission.  Patient given at home care as well strict return precautions.  Patient verbalized that they understood agreed to said plan.   Final Clinical Impression(s) / ED Diagnoses Final diagnoses:  Bad headache    Rx / DC Orders ED Discharge Orders     None       Carroll Sage, PA-C 03/16/21 0645    Gilda Crease, MD 03/16/21 [phone]

## 2021-03-16 NOTE — ED Triage Notes (Signed)
Patient reports headache onset 3 weeks ago worse these past 3 days , denies head injury , no emesis or photophobia.

## 2021-04-05 ENCOUNTER — Telehealth: Payer: Self-pay

## 2021-04-05 ENCOUNTER — Ambulatory Visit: Payer: Self-pay | Attending: Physician Assistant | Admitting: Physician Assistant

## 2021-04-05 ENCOUNTER — Other Ambulatory Visit: Payer: Self-pay

## 2021-04-05 DIAGNOSIS — H9313 Tinnitus, bilateral: Secondary | ICD-10-CM

## 2021-04-05 DIAGNOSIS — R519 Headache, unspecified: Secondary | ICD-10-CM

## 2021-04-05 DIAGNOSIS — E871 Hypo-osmolality and hyponatremia: Secondary | ICD-10-CM

## 2021-04-05 DIAGNOSIS — Z789 Other specified health status: Secondary | ICD-10-CM

## 2021-04-05 DIAGNOSIS — Z09 Encounter for follow-up examination after completed treatment for conditions other than malignant neoplasm: Secondary | ICD-10-CM

## 2021-04-05 NOTE — Telephone Encounter (Signed)
Called pt after the virtual appt from 04/05/21 to schedule an appt to Establish Care w/ a PCP in August approx per Provider's notes, or sooner if needed.  Called pt 2xs, no answer. *Pt also needs to come in to get Labwork done.

## 2021-04-05 NOTE — Progress Notes (Signed)
Patient ID: Andrea Wise, female   DOB: 10/20/1981, 40 y.o.   MRN: [id number]   Virtual Visit via Telephone Note  I connected with Andrea Wise on 04/05/21 at  3:10 PM EDT by telephone and verified that I am speaking with the correct person using two identifiers.  Location: Patient: home Provider: [facility] office   I discussed the limitations, risks, security and privacy concerns of performing an evaluation and management service by telephone and the availability of in person appointments. I also discussed with the patient that there may be a patient responsible charge related to this service. The patient expressed understanding and agreed to proceed.   History of Present Illness: After ED visit 03/16/2021 for HA.  No further HAs.  Motrin takes away HA.  Compliant with metformin.  No vision changes.  She is having some mild tinnitus.  Last labs showed hyponatremia  From ED A/P: Initial impression-patient presents with headache x3 days.  She is alert, does not appear to be in acute distress, vital signs reassuring.  Will obtain CT head for further evaluation, suspect this is a migraine, will provide patient a migraine cocktail and reassess.  Work-up-CT head negative for acute findings.  Reassessment patient was reassessed she was provided ibuprofen and states she feels much better.  Vital signs are remained stable.  Patient is agreeable for discharge at this time.  Rule out low suspicion for systemic infection as patient is nontoxic-appearing, vital signs reassuring, no obvious source of infection noted exam.  Low suspicion for CVA, intracranial head bleed or mass as there is no neuro deficits on my exam, CT head negative for acute findings.  Low suspicion for meningitis as there is no meningeal sign, no signs of infection, patient has no IV drug history.   Plan-suspect patient some from a migraine.  Will recommend over-the-counter pain medications follow-up with  neurology for further recommendations.  Vital signs have remained stable, no indication for hospital admission.  Patient given at home care as well strict return precautions.  Patient verbalized that they understood agreed to said plan.    Observations/Objective: NAD.  A&Ox3   Assessment and Plan: 1. Nonintractable episodic headache, unspecified headache type No red flags and CT WNL.  Use ibuprofen prn - Thyroid Panel With TSH; Future  2. Encounter for examination following treatment at hospital  3. Language barrier pacific interpreters used and additional time performing visit was required.   4. Tinnitus of both ears Refused flonase - Thyroid Panel With TSH; Future  5. Hyponatremia - Thyroid Panel With TSH; Future - Basic metabolic panel; Future Last A1C=5.4 01/2021  Follow Up Instructions: See PCP in 3 months;  Sooner if needed   I discussed the assessment and treatment plan with the patient. The patient was provided an opportunity to ask questions and all were answered. The patient agreed with the plan and demonstrated an understanding of the instructions.   The patient was advised to call back or seek an in-person evaluation if the symptoms worsen or if the condition fails to improve as anticipated.  I provided 23 minutes of non-face-to-face time during this encounter.   Georgian Co, PA-C

## 2021-04-06 ENCOUNTER — Other Ambulatory Visit: Payer: Self-pay

## 2021-04-06 ENCOUNTER — Ambulatory Visit: Payer: Self-pay | Attending: Family Medicine

## 2021-04-06 DIAGNOSIS — E871 Hypo-osmolality and hyponatremia: Secondary | ICD-10-CM

## 2021-04-06 DIAGNOSIS — H9313 Tinnitus, bilateral: Secondary | ICD-10-CM

## 2021-04-06 DIAGNOSIS — R519 Headache, unspecified: Secondary | ICD-10-CM

## 2021-04-07 LAB — BASIC METABOLIC PANEL
BUN/Creatinine Ratio: 25 — ABNORMAL HIGH (ref 9–23)
BUN: 15 mg/dL (ref 6–20)
CO2: 19 mmol/L — ABNORMAL LOW (ref 20–29)
Calcium: 9.5 mg/dL (ref 8.7–10.2)
Chloride: 102 mmol/L (ref 96–106)
Creatinine, Ser: 0.59 mg/dL (ref 0.57–1.00)
Glucose: 84 mg/dL (ref 65–99)
Potassium: 4.5 mmol/L (ref 3.5–5.2)
Sodium: 139 mmol/L (ref 134–144)
eGFR: 118 mL/min/{1.73_m2} (ref >59)

## 2021-04-07 LAB — THYROID PANEL WITH TSH
Free Thyroxine Index: 1.6 (ref 1.2–4.9)
T3 Uptake Ratio: 19 % — ABNORMAL LOW (ref 24–39)
T4, Total: 8.2 ug/dL (ref 4.5–12.0)
TSH: 1.74 u[IU]/mL (ref 0.450–4.500)

## 2021-05-08 ENCOUNTER — Other Ambulatory Visit: Payer: Self-pay

## 2021-05-08 ENCOUNTER — Emergency Department (HOSPITAL_COMMUNITY)
Admission: EM | Admit: 2021-05-08 | Discharge: 2021-05-08 | Disposition: A | Discharge status: Home / Self Care | Payer: Self-pay | Attending: Emergency Medicine | Admitting: Emergency Medicine

## 2021-05-08 DIAGNOSIS — E119 Type 2 diabetes mellitus without complications: Secondary | ICD-10-CM | POA: Insufficient documentation

## 2021-05-08 DIAGNOSIS — Z7984 Long term (current) use of oral hypoglycemic drugs: Secondary | ICD-10-CM | POA: Insufficient documentation

## 2021-05-08 DIAGNOSIS — M25512 Pain in left shoulder: Secondary | ICD-10-CM | POA: Insufficient documentation

## 2021-05-08 MED ORDER — MELOXICAM 15 MG PO TABS: 15.0000 mg | ORAL_TABLET | Freq: Every day | ORAL | 0 refills | Status: DC

## 2021-05-08 MED ORDER — MELOXICAM 15 MG PO TABS: 15.0000 mg | ORAL_TABLET | Freq: Every day | ORAL | 0 refills | Status: AC

## 2021-05-08 MED ORDER — METHOCARBAMOL 500 MG PO TABS: 500.0000 mg | ORAL_TABLET | Freq: Two times a day (BID) | ORAL | 0 refills | Status: DC

## 2021-05-08 NOTE — Discharge Instructions (Signed)
Use lidocaine patch, take the medications I have prescribed you for pain.  Follow-up with your primary care doctor soon as possible.  Return for any new or worsening symptoms such as weakness, numbness, inability to use the left arm.

## 2021-05-08 NOTE — ED Provider Notes (Signed)
[facility] EMERGENCY DEPARTMENT Provider Note   CSN: [id number] Arrival date & time: 05/08/21  1657     History No chief complaint on file.   Andrea Wise is a 40 y.o. female with past medical history of diabetes who presents emergency department with 2 days of left shoulder pain.  History is gathered by use of Engineer, structural.  She complains of pain and left shoulder blade region which is worse whenever she moves the arm.  It radiates into her shoulder.  She denies any numbness, weakness, loss of grip strength.  She has not tried anything for her pain except for Tea.  HPI     Past Medical History:  Diagnosis Date  . Diabetes mellitus without complication (HCC)   . No pertinent past medical history     Patient Active Problem List   Diagnosis Date Noted  . Influenza vaccine refused 01/25/2021  . Type 2 diabetes mellitus with hyperglycemia, without long-term current use of insulin (HCC) 01/25/2021    Past Surgical History:  Procedure Laterality Date  . APPENDECTOMY       OB History    Gravida  5   Para  4   Term  3   Preterm  1   AB  1   Living  4     SAB  1   IAB      Ectopic      Multiple      Live Births  1           Family History  Problem Relation Age of Onset  . Other Neg Hx     Social History   Tobacco Use  . Smoking status: Never Smoker  . Smokeless tobacco: Never Used  Substance Use Topics  . Alcohol use: No  . Drug use: No    Home Medications Prior to Admission medications   Medication Sig Start Date End Date Taking? Authorizing Provider  metFORMIN (GLUCOPHAGE) 500 MG tablet Take 1 tablet (500 mg total) by mouth daily with breakfast. 01/25/21   Anders Simmonds, PA-C  Multiple Vitamins-Minerals (HAIR SKIN AND NAILS FORMULA PO) Take 1 tablet by mouth daily.    [provider]  omeprazole (PRILOSEC) 20 MG capsule Take 1 capsule (20 mg total) by mouth daily. Patient not taking:  Reported on 03/16/2021 02/12/21   Horton, Mayer Masker, MD    Allergies    Patient has no known allergies.  Review of Systems   Review of Systems Shoulder pain, negative for weakness, neck pain, fever or chills Physical Exam Updated Vital Signs BP 109/72 (BP Location: Left Arm)   Pulse 69   Temp 98.3 F (36.8 C) (Oral)   Resp 16   SpO2 99%   Physical Exam Vitals and nursing note reviewed.  Constitutional:      General: She is not in acute distress.    Appearance: She is well-developed. She is not diaphoretic.  HENT:     Head: Normocephalic and atraumatic.  Eyes:     General: No scleral icterus.    Conjunctiva/sclera: Conjunctivae normal.  Cardiovascular:     Rate and Rhythm: Normal rate and regular rhythm.     Heart sounds: Normal heart sounds. No murmur heard. No friction rub. No gallop.   Pulmonary:     Effort: Pulmonary effort is normal. No respiratory distress.     Breath sounds: Normal breath sounds.  Abdominal:     General: Bowel sounds are normal. There is no  distension.     Palpations: Abdomen is soft. There is no mass.     Tenderness: There is no abdominal tenderness. There is no guarding.  Musculoskeletal:     Cervical back: Normal range of motion.     Comments: Tenderness to palpation of the left shoulder blade region.  Mild pain with adduction of the arm across the chest,  Skin:    General: Skin is warm and dry.  Neurological:     Mental Status: She is alert and oriented to person, place, and time.  Psychiatric:        Behavior: Behavior normal.      ED Results / Procedures / Treatments   Labs (all labs ordered are listed, but only abnormal results are displayed) Labs Reviewed - No data to display  EKG None  Radiology No results found.  Procedures Procedures   Medications Ordered in ED Medications - No data to display  ED Course  I have reviewed the triage vital signs and the nursing notes.  Pertinent labs & imaging results that were  available during my care of the patient were reviewed by me and considered in my medical decision making (see chart for details).    MDM Rules/Calculators/A&P                          Pain with range of motion of the arm, pain is musculoskeletal, patient will be discharged with meloxicam and Robaxin, she may use over-the-counter lidocaine patch and follow close with her primary care physician, no evidence of emergent cause of pain such as pneumothorax.  Hemodynamically stable without cough, shortness of breath, chest pain, near syncope Final Clinical Impression(s) / ED Diagnoses Final diagnoses:  Trigger point of shoulder region, left    Rx / DC Orders ED Discharge Orders    None       Arthor Captain, PA-C 05/08/21 1910    Long, Arlyss Repress, MD 05/08/21 2015

## 2021-05-08 NOTE — ED Triage Notes (Signed)
Used interpretor, pt having back and shoulder pain. No acute distress is noted at triage.

## 2021-05-24 ENCOUNTER — Other Ambulatory Visit: Payer: Self-pay

## 2021-05-24 ENCOUNTER — Ambulatory Visit: Payer: Self-pay

## 2021-05-24 DIAGNOSIS — E1165 Type 2 diabetes mellitus with hyperglycemia: Secondary | ICD-10-CM

## 2021-05-24 MED ORDER — METFORMIN HCL 500 MG PO TABS: 500.0000 mg | ORAL_TABLET | Freq: Every day | ORAL | 1 refills | Status: DC

## 2021-05-24 NOTE — Telephone Encounter (Signed)
Noted  

## 2021-05-24 NOTE — Telephone Encounter (Signed)
Pt. And daughter report pt. Started having dizziness and vertigo this morning. Reports "she can't get up and walk around because it's so bad." Instructed to call 911. Verbalizes understanding.

## 2021-05-24 NOTE — Telephone Encounter (Signed)
Reason for Disposition  Sounds like a life-threatening emergency to the triager  Answer Assessment - Initial Assessment Questions 1. DESCRIPTION: "Describe your dizziness."     Dizzy 2. VERTIGO: "Do you feel like either you or the room is spinning or tilting?"      Yes 3. LIGHTHEADED: "Do you feel lightheaded?" (e.g., somewhat faint, woozy, weak upon standing)     Yes 4. SEVERITY: "How bad is it?"  "Can you walk?"   - MILD: Feels slightly dizzy and unsteady, but is walking normally.   - MODERATE: Feels unsteady when walking, but not falling; interferes with normal activities (e.g., school, work).   - SEVERE: Unable to walk without falling, or requires assistance to walk without falling.     Severe 5. ONSET:  "When did the dizziness begin?"     Today 6. AGGRAVATING FACTORS: "Does anything make it worse?" (e.g., standing, change in head position)     Moving 7. CAUSE: "What do you think is causing the dizziness?"     Unsure 8. RECURRENT SYMPTOM: "Have you had dizziness before?" If Yes, ask: "When was the last time?" "What happened that time?"     Yes 9. OTHER SYMPTOMS: "Do you have any other symptoms?" (e.g., headache, weakness, numbness, vomiting, earache)     No 10. PREGNANCY: "Is there any chance you are pregnant?" "When was your last menstrual period?"       No  Protocols used: Dizziness - Vertigo-A-AH

## 2021-05-31 ENCOUNTER — Telehealth: Payer: Self-pay | Admitting: Physician Assistant

## 2021-05-31 NOTE — Telephone Encounter (Signed)
Pt called in for assistance. Pt was prescribed  by provider [place]. Pt says that current strength or dose is causing ringing in her ear. Pt would like to know if provider could make some changes to her Rx and send it in to the [facility] on [facility] [facility] instead?   Pt says that she was told by the pharmacy that it was sent to that they are closed.   Medication:  metFORMIN (GLUCOPHAGE) 500 MG tablet      Please assist.

## 2021-06-02 NOTE — Telephone Encounter (Signed)
Pts daughter calling in again regarding changes to this medication. She is requesting to have a follow up call today before the weekend. Please advise.     English: [phone]  Spanish: [phone]

## 2021-06-06 ENCOUNTER — Ambulatory Visit: Payer: Self-pay | Admitting: *Deleted

## 2021-06-06 NOTE — Telephone Encounter (Signed)
Reason for Disposition  [1] Caller has NON-URGENT medicine question about med that PCP prescribed AND [2] triager unable to answer question  Answer Assessment - Initial Assessment Questions 1. NAME of MEDICATION: "What medicine are you calling about?"     Metformin 500 mg  2. QUESTION: "What is your question?" (e.g., double dose of medicine, side effect)     Can medication dose be in half due to side effects of ringing in ears. 3. PRESCRIBING HCP: "Who prescribed it?" Reason: if prescribed by specialist, call should be referred to that group.     McClung , PA 4. SYMPTOMS: "Do you have any symptoms?"     Ringing in ears 5. SEVERITY: If symptoms are present, ask "Are they mild, moderate or severe?"     Na  6. PREGNANCY:  "Is there any chance that you are pregnant?" "When was your last menstrual period?"     na  Protocols used: Medication Question Call-A-AH

## 2021-06-06 NOTE — Telephone Encounter (Signed)
Andrea Wise you have seen pt twice please advise

## 2021-06-06 NOTE — Telephone Encounter (Signed)
Interpreter ID [id number] use to communicate with patient.  Patient called to request information regarding medication. Patient reports she is still waiting on Glucophage refill  and decrease in dose due to ringing in ears. Patient reports she has not been taking Glucophage 500 mg daily x approx. 3 weeks. Patient ran out of medication and reports it was causing her to have ringing in her ears. Blood glucose readings , fasting in am have been 93, 83, 71 and prior to bedtime 10, 126.102. patient requesting prior to refill is she can take 1/2 the dose 250 mg instead due to ringing in her ears when she is taking 500 mg. Patient also wanted to report edge of her great toenails on both feet are dark not black. Some times feels like needles. Feet not cool to touch and feel normal. Denies pain in feet and toes. Appt scheduled for 06/21/21 for hospital f/u. Please advise today regarding medication request for Glucophage prior to refill. Care advise given. Patient verbalized understanding of care advise and to call back or go to [facility] or ED if symptoms worsen.

## 2021-06-07 ENCOUNTER — Other Ambulatory Visit: Payer: Self-pay

## 2021-06-07 ENCOUNTER — Encounter (HOSPITAL_BASED_OUTPATIENT_CLINIC_OR_DEPARTMENT_OTHER): Payer: Self-pay | Admitting: Emergency Medicine

## 2021-06-07 ENCOUNTER — Emergency Department (HOSPITAL_BASED_OUTPATIENT_CLINIC_OR_DEPARTMENT_OTHER)
Admission: EM | Admit: 2021-06-07 | Discharge: 2021-06-07 | Disposition: A | Discharge status: Home / Self Care | Payer: Self-pay | Attending: Emergency Medicine | Admitting: Emergency Medicine

## 2021-06-07 DIAGNOSIS — Z7984 Long term (current) use of oral hypoglycemic drugs: Secondary | ICD-10-CM | POA: Insufficient documentation

## 2021-06-07 DIAGNOSIS — R202 Paresthesia of skin: Secondary | ICD-10-CM | POA: Insufficient documentation

## 2021-06-07 DIAGNOSIS — E119 Type 2 diabetes mellitus without complications: Secondary | ICD-10-CM | POA: Insufficient documentation

## 2021-06-07 DIAGNOSIS — M79671 Pain in right foot: Secondary | ICD-10-CM

## 2021-06-07 LAB — CBG MONITORING, ED: Glucose-Capillary: 88 mg/dL (ref 70–99)

## 2021-06-07 MED ORDER — IBUPROFEN 600 MG PO TABS: 600.0000 mg | ORAL_TABLET | Freq: Four times a day (QID) | ORAL | 0 refills | Status: DC | PRN

## 2021-06-07 NOTE — ED Provider Notes (Signed)
[facility] EMERGENCY DEPT Provider Note   CSN: [id number] Arrival date & time: 06/07/21  1742     History Chief Complaint  Patient presents with   Leg Pain    Andrea Wise is a 40 y.o. female.  40 year old female presents with bilateral lower extremity paresthesias x3 days.  Patient states that became worse after she was playing soccer and she is taken the ball.  States that sensation is better with rest.  Denies any other paresthesias in her body.  No recent headaches.  Remote history of diabetes but blood sugar has been normal.  Video interpreter used for this encounter      Past Medical History:  Diagnosis Date   Diabetes mellitus without complication (HCC)    No pertinent past medical history     Patient Active Problem List   Diagnosis Date Noted   Influenza vaccine refused 01/25/2021   Type 2 diabetes mellitus with hyperglycemia, without long-term current use of insulin (HCC) 01/25/2021    Past Surgical History:  Procedure Laterality Date   APPENDECTOMY       OB History     Gravida  5   Para  4   Term  3   Preterm  1   AB  1   Living  4      SAB  1   IAB      Ectopic      Multiple      Live Births  1           Family History  Problem Relation Age of Onset   Other Neg Hx     Social History   Tobacco Use   Smoking status: Never   Smokeless tobacco: Never  Substance Use Topics   Alcohol use: No   Drug use: No    Home Medications Prior to Admission medications   Medication Sig Start Date End Date Taking? Authorizing Provider  meloxicam (MOBIC) 15 MG tablet Take 1 tablet (15 mg total) by mouth daily. Take 1 daily with food. 05/08/21   Arthor Captain, PA-C  metFORMIN (GLUCOPHAGE) 500 MG tablet Take 1 tablet (500 mg total) by mouth daily with breakfast. 05/24/21   Anders Simmonds, PA-C  methocarbamol (ROBAXIN) 500 MG tablet Take 1 tablet (500 mg total) by mouth 2 (two) times daily. 05/08/21   Harris,  Cammy Copa, PA-C  Multiple Vitamins-Minerals (HAIR SKIN AND NAILS FORMULA PO) Take 1 tablet by mouth daily.    [provider]  omeprazole (PRILOSEC) 20 MG capsule Take 1 capsule (20 mg total) by mouth daily. Patient not taking: Reported on 03/16/2021 02/12/21   Horton, Mayer Masker, MD    Allergies    Patient has no known allergies.  Review of Systems   Review of Systems  All other systems reviewed and are negative.  Physical Exam Updated Vital Signs BP 121/78 (BP Location: Right Arm)   Pulse 65   Temp 98.4 F (36.9 C) (Oral)   Resp 16   Ht 1.549 m (5\' 1" )   Wt 62 kg   SpO2 98%   BMI 25.83 kg/m   Physical Exam Vitals and nursing note reviewed.  Constitutional:      General: She is not in acute distress.    Appearance: Normal appearance. She is well-developed. She is not toxic-appearing.  HENT:     Head: Normocephalic and atraumatic.  Eyes:     General: Lids are normal.     Conjunctiva/sclera: Conjunctivae normal.  Pupils: Pupils are equal, round, and reactive to light.  Neck:     Thyroid: No thyroid mass.     Trachea: No tracheal deviation.  Cardiovascular:     Rate and Rhythm: Normal rate and regular rhythm.     Heart sounds: Normal heart sounds. No murmur heard.   No gallop.  Pulmonary:     Effort: Pulmonary effort is normal. No respiratory distress.     Breath sounds: Normal breath sounds. No stridor. No decreased breath sounds, wheezing, rhonchi or rales.  Abdominal:     General: There is no distension.     Palpations: Abdomen is soft.     Tenderness: There is no abdominal tenderness. There is no rebound.  Musculoskeletal:        General: No tenderness. Normal range of motion.     Cervical back: Normal range of motion and neck supple.  Skin:    General: Skin is warm and dry.     Findings: No abrasion or rash.  Neurological:     Mental Status: She is alert and oriented to person, place, and time. Mental status is at baseline.     GCS: GCS eye  subscore is 4. GCS verbal subscore is 5. GCS motor subscore is 6.     Cranial Nerves: Cranial nerves are intact. No cranial nerve deficit.     Sensory: Sensation is intact. No sensory deficit.     Motor: Motor function is intact.  Psychiatric:        Attention and Perception: Attention normal.        Speech: Speech normal.        Behavior: Behavior normal.    ED Results / Procedures / Treatments   Labs (all labs ordered are listed, but only abnormal results are displayed) Labs Reviewed  CBG MONITORING, ED    EKG None  Radiology No results found.  Procedures Procedures   Medications Ordered in ED Medications - No data to display  ED Course  I have reviewed the triage vital signs and the nursing notes.  Pertinent labs & imaging results that were available during my care of the patient were reviewed by me and considered in my medical decision making (see chart for details).    MDM Rules/Calculators/A&P                         Patient's neurological exam is normal at this time.  Will prescribe anti-inflammatories. Final Clinical Impression(s) / ED Diagnoses Final diagnoses:  None    Rx / DC Orders ED Discharge Orders     None        Lorre Nick, MD 06/07/21 2125

## 2021-06-07 NOTE — ED Notes (Signed)
This RN presented the AVS utilizing Teachback Method. Patient verbalizes understanding of Discharge Instructions. Opportunity for Questioning and Answers were provided. Patient Discharged from ED ambulatory to Home.   

## 2021-06-07 NOTE — ED Triage Notes (Addendum)
Interpretor used. Pt arrives to ED with c/o of bilateral lower leg pain and tingling x3 days. Pt reports she can walk but her legs are weak. No pain and tingling above the knee. No swelling, coolness/hot, discoloration of legs. Pt w/ hx of diabetes and has not taken meds in 3 months. No hx of diabetic neuropathy.

## 2021-06-09 ENCOUNTER — Emergency Department (HOSPITAL_COMMUNITY): Payer: Self-pay

## 2021-06-09 ENCOUNTER — Other Ambulatory Visit: Payer: Self-pay

## 2021-06-09 ENCOUNTER — Encounter (HOSPITAL_COMMUNITY): Payer: Self-pay

## 2021-06-09 ENCOUNTER — Emergency Department (HOSPITAL_COMMUNITY)
Admission: EM | Admit: 2021-06-09 | Discharge: 2021-06-09 | Disposition: A | Discharge status: Home / Self Care | Payer: Self-pay | Attending: Emergency Medicine | Admitting: Emergency Medicine

## 2021-06-09 DIAGNOSIS — E119 Type 2 diabetes mellitus without complications: Secondary | ICD-10-CM | POA: Insufficient documentation

## 2021-06-09 DIAGNOSIS — R519 Headache, unspecified: Secondary | ICD-10-CM

## 2021-06-09 DIAGNOSIS — R079 Chest pain, unspecified: Secondary | ICD-10-CM

## 2021-06-09 DIAGNOSIS — F419 Anxiety disorder, unspecified: Secondary | ICD-10-CM

## 2021-06-09 DIAGNOSIS — Z7984 Long term (current) use of oral hypoglycemic drugs: Secondary | ICD-10-CM | POA: Insufficient documentation

## 2021-06-09 DIAGNOSIS — R0789 Other chest pain: Secondary | ICD-10-CM | POA: Insufficient documentation

## 2021-06-09 LAB — CBC
HCT: 40.6 % (ref 36.0–46.0)
Hemoglobin: 13.4 g/dL (ref 12.0–15.0)
MCH: 27.7 pg (ref 26.0–34.0)
MCHC: 33 g/dL (ref 30.0–36.0)
MCV: 84.1 fL (ref 80.0–100.0)
Platelets: 210 10*3/uL (ref 150–400)
RBC: 4.83 MIL/uL (ref 3.87–5.11)
RDW: 14.1 % (ref 11.5–15.5)
WBC: 5 10*3/uL (ref 4.0–10.5)
nRBC: 0 % (ref 0.0–0.2)

## 2021-06-09 LAB — BASIC METABOLIC PANEL
Anion gap: 6 (ref 5–15)
BUN: 13 mg/dL (ref 6–20)
CO2: 26 mmol/L (ref 22–32)
Calcium: 9.4 mg/dL (ref 8.9–10.3)
Chloride: 106 mmol/L (ref 98–111)
Creatinine, Ser: 0.63 mg/dL (ref 0.44–1.00)
GFR, Estimated: >60 mL/min (ref >60)
Glucose, Bld: 97 mg/dL (ref 70–99)
Potassium: 3.7 mmol/L (ref 3.5–5.1)
Sodium: 138 mmol/L (ref 135–145)

## 2021-06-09 LAB — TROPONIN I (HIGH SENSITIVITY): Troponin I (High Sensitivity): <2 ng/L (ref <18)

## 2021-06-09 LAB — I-STAT BETA HCG BLOOD, ED (MC, WL, AP ONLY): I-stat hCG, quantitative: <5 m[IU]/mL (ref <5)

## 2021-06-09 MED ORDER — METOCLOPRAMIDE HCL 5 MG/ML IJ SOLN
10.0000 mg | Freq: Once | INTRAMUSCULAR | Status: AC
  Administered 2021-06-09: 10 mg via INTRAVENOUS
  Filled 2021-06-09: qty 2

## 2021-06-09 MED ORDER — KETOROLAC TROMETHAMINE 15 MG/ML IJ SOLN
15.0000 mg | Freq: Once | INTRAMUSCULAR | Status: DC
  Filled 2021-06-09: qty 1

## 2021-06-09 MED ORDER — DIPHENHYDRAMINE HCL 50 MG/ML IJ SOLN
50.0000 mg | Freq: Once | INTRAMUSCULAR | Status: AC
  Administered 2021-06-09: 50 mg via INTRAVENOUS
  Filled 2021-06-09: qty 1

## 2021-06-09 MED ORDER — SODIUM CHLORIDE 0.9 % IV BOLUS
1000.0000 mL | Freq: Once | INTRAVENOUS | Status: AC
  Administered 2021-06-09: 1000 mL via INTRAVENOUS

## 2021-06-09 NOTE — ED Triage Notes (Signed)
Interpreter used for triage.   Arrives EMS from home.    Pt reports waking up from sleep with left sided chest discomfort.

## 2021-06-09 NOTE — ED Provider Notes (Signed)
[facility]-EMERGENCY DEPT Provider Note   CSN: [id number] Arrival date & time: 06/09/21  5361     History Chief Complaint  Patient presents with   Chest Pain    Andrea Wise Andrea Wise is a 40 y.o. female with Pmhx Diabetes (has not been on metformin recently) who presents to the ED today with complaint of gradual onset, constant, very mild, diffuse headache that began earlier this morning and woke her up out of her sleep. Pt states her head feels "hot" and mildly painful. She states that the pain seemed to go down into her left chest as well and made her feel like she couldn't breathe. Pt states that that has seemed to dissipate since being in the ED. She denies hx of headaches in the past however per chart review was seen in May for headache likely thought to be related to migraine. Pt does admit to having had feelings of "anxiety" in the pit of her stomach since last November when she was diagnosed with diabetes. She states that she recently ran out of her Metformin however her sugars have been normal. She called her PCP to see if she needed to be on Metformin however was told she would need to be evaluate in the office prior to making that decision over the phone. She has an appointment with them on 07/20. No Si, HI, or AVH. Pt denies fevers, chills, neck stiffness, blurry vision, double vision, vomiting, or any other associated symptoms.   The history is provided by the patient and medical records. The history is limited by a language barrier. A language interpreter was used.      Past Medical History:  Diagnosis Date   Diabetes mellitus without complication (HCC)    No pertinent past medical history     Patient Active Problem List   Diagnosis Date Noted   Influenza vaccine refused 01/25/2021   Type 2 diabetes mellitus with hyperglycemia, without long-term current use of insulin (HCC) 01/25/2021    Past Surgical History:  Procedure Laterality Date    APPENDECTOMY       OB History     Gravida  5   Para  4   Term  3   Preterm  1   AB  1   Living  4      SAB  1   IAB      Ectopic      Multiple      Live Births  1           Family History  Problem Relation Age of Onset   Other Neg Hx     Social History   Tobacco Use   Smoking status: Never   Smokeless tobacco: Never  Substance Use Topics   Alcohol use: No   Drug use: No    Home Medications Prior to Admission medications   Medication Sig Start Date End Date Taking? Authorizing Provider  ibuprofen (ADVIL) 600 MG tablet Take 1 tablet (600 mg total) by mouth every 6 (six) hours as needed. 06/07/21   Lorre Nick, MD  meloxicam (MOBIC) 15 MG tablet Take 1 tablet (15 mg total) by mouth daily. Take 1 daily with food. 05/08/21   Arthor Captain, PA-C  metFORMIN (GLUCOPHAGE) 500 MG tablet Take 1 tablet (500 mg total) by mouth daily with breakfast. 05/24/21   Anders Simmonds, PA-C  methocarbamol (ROBAXIN) 500 MG tablet Take 1 tablet (500 mg total) by mouth 2 (two) times daily. 05/08/21  Harris, Abigail, PA-C  Multiple Vitamins-Minerals (HAIR SKIN AND NAILS FORMULA PO) Take 1 tablet by mouth daily.    [provider]  omeprazole (PRILOSEC) 20 MG capsule Take 1 capsule (20 mg total) by mouth daily. Patient not taking: Reported on 03/16/2021 02/12/21   Horton, Mayer Maskerourtney F, MD    Allergies    Patient has no known allergies.  Review of Systems   Review of Systems  Constitutional:  Negative for chills and fever.  Eyes:  Negative for visual disturbance.  Respiratory:  Negative for cough and shortness of breath.   Cardiovascular:  Positive for chest pain.  Gastrointestinal:  Positive for nausea. Negative for vomiting.  Musculoskeletal:  Negative for myalgias, neck pain and neck stiffness.  Neurological:  Positive for headaches.  Psychiatric/Behavioral:  The patient is nervous/anxious.   All other systems reviewed and are negative.  Physical Exam Updated  Vital Signs BP 101/67 (BP Location: Right Arm)   Pulse 68   Temp 98.2 F (36.8 C) (Oral)   Resp 18   Ht 5' 1.5" (1.562 m)   Wt 62 kg   SpO2 99%   BMI 25.41 kg/m   Physical Exam Vitals and nursing note reviewed.  Constitutional:      Appearance: She is not ill-appearing or diaphoretic.  HENT:     Head: Normocephalic and atraumatic.  Eyes:     Extraocular Movements: Extraocular movements intact.     Conjunctiva/sclera: Conjunctivae normal.     Pupils: Pupils are equal, round, and reactive to light.  Cardiovascular:     Rate and Rhythm: Normal rate and regular rhythm.     Heart sounds: Normal heart sounds.  Pulmonary:     Effort: Pulmonary effort is normal.     Breath sounds: Normal breath sounds. No decreased breath sounds, wheezing, rhonchi or rales.  Chest:     Chest wall: Tenderness present.     Comments: Mild left chest wall TTP Abdominal:     Palpations: Abdomen is soft.     Tenderness: There is no abdominal tenderness. There is no guarding or rebound.  Musculoskeletal:     Cervical back: Neck supple.     Right lower leg: No edema.     Left lower leg: No edema.  Skin:    General: Skin is warm and dry.  Neurological:     Mental Status: She is alert.     Comments: Alert and oriented to self, place, time and event.   Speech is fluent, clear without dysarthria or dysphasia.   Strength 5/5 in upper/lower extremities   Sensation intact in upper/lower extremities   Normal gait.  Negative Romberg. No pronator drift.  Normal finger-to-nose and feet tapping.  CN I not tested  CN II grossly intact visual fields bilaterally. Did not visualize posterior eye.  CN III, IV, VI PERRLA and EOMs intact bilaterally  CN V Intact sensation to sharp and light touch to the face  CN VII facial movements symmetric  CN VIII not tested  CN IX, X no uvula deviation, symmetric rise of soft palate  CN XI 5/5 SCM and trapezius strength bilaterally  CN XII Midline tongue protrusion,  symmetric L/R movements      ED Results / Procedures / Treatments   Labs (all labs ordered are listed, but only abnormal results are displayed) Labs Reviewed  BASIC METABOLIC PANEL  CBC  I-STAT BETA HCG BLOOD, ED ([facility], [facility], [facility] ONLY)  TROPONIN I (HIGH SENSITIVITY)    EKG EKG Interpretation  Date/Time:  Friday June 09 2021 06:48:34 EDT Ventricular Rate:  68 PR Interval:  210 QRS Duration: 80 QT Interval:  384 QTC Calculation: 408 R Axis:   83 Text Interpretation: Sinus rhythm with 1st degree A-V block Otherwise normal ECG No significant change was found Confirmed by Glynn Octave [phone]) on 06/09/2021 6:54:23 AM  Radiology DG Chest 2 View  Result Date: 06/09/2021 CLINICAL DATA:  40 year old female with history of left-sided chest pain since yesterday evening. EXAM: CHEST - 2 VIEW COMPARISON:  Chest x-ray 11/08/2020. FINDINGS: Lung volumes are normal. No consolidative airspace disease. No pleural effusions. No pneumothorax. No pulmonary nodule or mass noted. Pulmonary vasculature and the cardiomediastinal silhouette are within normal limits. IMPRESSION: No radiographic evidence of acute cardiopulmonary disease. Electronically Signed   By: Trudie Reed M.D.   On: 06/09/2021 07:15   CT Head Wo Contrast  Result Date: 06/09/2021 CLINICAL DATA:  Headache, new or worsening. Additional history provided: Dizziness, headache, shortness of breath. EXAM: CT HEAD WITHOUT CONTRAST TECHNIQUE: Contiguous axial images were obtained from the base of the skull through the vertex without intravenous contrast. COMPARISON:  Prior head CT 03/16/2021. FINDINGS: Brain: Cerebral volume is normal. There is no acute intracranial hemorrhage. No demarcated cortical infarct. No extra-axial fluid collection. No evidence of an intracranial mass. No midline shift. Vascular: No hyperdense vessel. Skull: Normal. Negative for fracture or focal lesion. Sinuses/Orbits: Visualized orbits show no acute finding. No  significant paranasal sinus disease at the imaged levels. IMPRESSION: Unremarkable non-contrast CT appearance of the brain. No evidence of acute intracranial abnormality. Electronically Signed   By: Jackey Loge DO   On: 06/09/2021 10:11    Procedures Procedures   Medications Ordered in ED Medications  ketorolac (TORADOL) 15 MG/ML injection 15 mg (0 mg Intravenous Hold 06/09/21 1033)  metoCLOPramide (REGLAN) injection 10 mg (10 mg Intravenous Given 06/09/21 0929)  diphenhydrAMINE (BENADRYL) injection 50 mg (50 mg Intravenous Given 06/09/21 0929)  sodium chloride 0.9 % bolus 1,000 mL (0 mLs Intravenous Stopped 06/09/21 1033)    ED Course  I have reviewed the triage vital signs and the nursing notes.  Pertinent labs & imaging results that were available during my care of the patient were reviewed by me and considered in my medical decision making (see chart for details).    MDM Rules/Calculators/A&P                          40 year old female who presents to the ED today with complaint of diffuse headache that woke her up out of her sleep this morning, very mild, denies worst headache of life.  States pain felt like it washed down and then settled into her left chest with mild shortness of breath.  Does admit to anxiety.  On arrival to the ED today vitals are stable.  Patient afebrile, nontachycardic and nontachypneic and appears to be in no acute distress.  Work-up started for ACS including EKG with first-degree AV block, no acute ischemic changes.  Chest x-ray clear.  Troponin less than 2.  CBC and BMP are pending.  On my exam she has mild chest wall tenderness palpation.  She has no focal neurodeficits on exam today.  Does seem more anxiety related as patient is tearful on exam however given she denies history of headaches we will plan for CT head.  Will treat for migraine headache with complaint of headache and nausea.  I have lower suspicion for ACS at this time.  Doubt PE and patient is PERC  negative.  Doubt acute intracranial abnormalities including subarachnoid versus vertebral artery dissection versus mass versus other acute abnormality.  No meningeal signs.  Will reevaluate after headache cocktail and CT head as well as labs.  We will plan to have patient follow-up with her PCP.  CBC without leukocytosis and hemoglobin stable at 13.4. BMP with glucose 97.  No other electrolyte abnormalities. Beta hcg negative  CT Head without acute findings  Pt treated with headache cocktail including reglan and benadryl with fluids. After CT Head returned IV toradol ordered however per nursing staff patient declined as she does not want anymore medications. On reevaluation pt resting comfortably. Reports improvement in her mild headache at this time. Do not feel pt requires additional workup at this time. Will have her follow up with PCP for same. Strict return precautions discussed. She is in agreement with plan and stable for discharge home.   This note was prepared using Dragon voice recognition software and may include unintentional dictation errors due to the inherent limitations of voice recognition software.   Final Clinical Impression(s) / ED Diagnoses Final diagnoses:  Nonspecific chest pain  Bad headache  Anxiety    Rx / DC Orders ED Discharge Orders     None        Discharge Instructions      Your workup was overall reassuring in the ED today. Please follow up with your PCP regarding ED visit today. Drink plenty of fluids to stay hydrated. Take Tylenol and Ibuprofen as needed for headache.   Return to the ED for any new/worsening symptoms.        Tanda Rockers, PA-C 06/09/21 1147    Alvira Monday, MD 06/10/21 2119

## 2021-06-09 NOTE — Discharge Instructions (Addendum)
Your workup was overall reassuring in the ED today. Please follow up with your PCP regarding ED visit today. Drink plenty of fluids to stay hydrated. Take Tylenol and Ibuprofen as needed for headache.   Return to the ED for any new/worsening symptoms.

## 2021-06-21 ENCOUNTER — Ambulatory Visit: Payer: Self-pay | Attending: Physician Assistant | Admitting: Physician Assistant

## 2021-06-21 ENCOUNTER — Other Ambulatory Visit: Payer: Self-pay

## 2021-06-21 ENCOUNTER — Encounter: Payer: Self-pay | Admitting: Physician Assistant

## 2021-06-21 DIAGNOSIS — R519 Headache, unspecified: Secondary | ICD-10-CM

## 2021-06-21 DIAGNOSIS — Z789 Other specified health status: Secondary | ICD-10-CM

## 2021-06-21 DIAGNOSIS — Z87898 Personal history of other specified conditions: Secondary | ICD-10-CM

## 2021-06-21 DIAGNOSIS — Z09 Encounter for follow-up examination after completed treatment for conditions other than malignant neoplasm: Secondary | ICD-10-CM

## 2021-06-21 DIAGNOSIS — M545 Low back pain, unspecified: Secondary | ICD-10-CM

## 2021-06-21 MED ORDER — IBUPROFEN 600 MG PO TABS
600.0000 mg | ORAL_TABLET | Freq: Four times a day (QID) | ORAL | 0 refills | Status: AC | PRN
  Filled 2021-06-21: qty 60, 15d supply, fill #0

## 2021-06-21 MED ORDER — METHOCARBAMOL 500 MG PO TABS
1000.0000 mg | ORAL_TABLET | Freq: Three times a day (TID) | ORAL | 0 refills | Status: DC | PRN
  Filled 2021-06-21: qty 60, 10d supply, fill #0

## 2021-06-21 NOTE — Progress Notes (Signed)
Patient ID: Andrea Wise, female   DOB: 11-19-1981, 40 y.o.   MRN: [id number]  Virtual Visit via Telephone Note  I connected with Andrea Wise on 06/21/21 at 10:10 AM EDT by telephone and verified that I am speaking with the correct person using two identifiers.  Location: Patient: home Provider: [facility] office   I discussed the limitations, risks, security and privacy concerns of performing an evaluation and management service by telephone and the availability of in person appointments. I also discussed with the patient that there may be a patient responsible charge related to this service. The patient expressed understanding and agreed to proceed.   History of Present Illness: After ED visit 06/09/2021 with HA.  HA has resolved.  No further CP.  Wants to know what kinds of vitamins to take.  Occasionally has LBP without radiculopathy.  Concerned about her kidneys.  Not taking metformin.  A1C=5.4 01/2021.  She checks her blood sugars daily and fasting blood sugars <100.    From ED note: Andrea Wise is a 40 y.o. female with Pmhx Diabetes (has not been on metformin recently) who presents to the ED today with complaint of gradual onset, constant, very mild, diffuse headache that began earlier this morning and woke her up out of her sleep. Pt states her head feels "hot" and mildly painful. She states that the pain seemed to go down into her left chest as well and made her feel like she couldn't breathe. Pt states that that has seemed to dissipate since being in the ED. She denies hx of headaches in the past however per chart review was seen in May for headache likely thought to be related to migraine. Pt does admit to having had feelings of "anxiety" in the pit of her stomach since last November when she was diagnosed with diabetes. She states that she recently ran out of her Metformin however her sugars have been normal. She called her PCP to see if she needed to be  on Metformin however was told she would need to be evaluate in the office prior to making that decision over the phone. She has an appointment with them on 07/20. No Si, HI, or AVH. Pt denies fevers, chills, neck stiffness, blurry vision, double vision, vomiting, or any other associated symptoms.  From A/P: 40 year old female who presents to the ED today with complaint of diffuse headache that woke her up out of her sleep this morning, very mild, denies worst headache of life.  States pain felt like it washed down and then settled into her left chest with mild shortness of breath.  Does admit to anxiety.  On arrival to the ED today vitals are stable.  Patient afebrile, nontachycardic and nontachypneic and appears to be in no acute distress.  Work-up started for ACS including EKG with first-degree AV block, no acute ischemic changes.  Chest x-ray clear.  Troponin less than 2.  CBC and BMP are pending.  On my exam she has mild chest wall tenderness palpation.  She has no focal neurodeficits on exam today.  Does seem more anxiety related as patient is tearful on exam however given she denies history of headaches we will plan for CT head.  Will treat for migraine headache with complaint of headache and nausea.  I have lower suspicion for ACS at this time.  Doubt PE and patient is PERC negative.  Doubt acute intracranial abnormalities including subarachnoid versus vertebral artery dissection versus mass versus other  acute abnormality.  No meningeal signs.  Will reevaluate after headache cocktail and CT head as well as labs.  We will plan to have patient follow-up with her PCP.   CBC without leukocytosis and hemoglobin stable at 13.4. BMP with glucose 97.  No other electrolyte abnormalities. Beta hcg negative   CT Head without acute findings   Pt treated with headache cocktail including reglan and benadryl with fluids. After CT Head returned IV toradol ordered however per nursing staff patient declined as she  does not want anymore medications. On reevaluation pt resting comfortably. Reports improvement in her mild headache at this time. Do not feel pt requires additional workup at this time. Will have her follow up with PCP for same. Strict return precautions discussed. She is in agreement with plan and stable for discharge home.    Observations/Objective:  NAD.  A&Ox3   Assessment and Plan: 1. Nonintractable episodic headache, unspecified headache type resolved  2. Language barrier [employer] interpreters used and additional time performing visit was required.   3. History of prediabetes I have had a lengthy discussion and provided education about insulin resistance and the intake of too much sugar/refined carbohydrates.  I have advised the patient to work at a goal of eliminating sugary drinks, candy, desserts, sweets, refined sugars, processed foods, and white carbohydrates.  The patient expresses understanding.   4. Low back pain without sciatica, unspecified back pain laterality, unspecified chronicity Kidney function was fine on labs - ibuprofen (ADVIL) 600 MG tablet; Take 1 tablet (600 mg total) by mouth every 6 (six) hours as needed.  Dispense: 60 tablet; Refill: 0 - methocarbamol (ROBAXIN) 500 MG tablet; Take 2 tablets (1,000 mg total) by mouth every 8 (eight) hours as needed for muscle spasms.  Dispense: 60 tablet; Refill: 0  5. Encounter for examination following treatment at hospital   Follow Up Instructions: Assign PCP in 2-3 months   I discussed the assessment and treatment plan with the patient. The patient was provided an opportunity to ask questions and all were answered. The patient agreed with the plan and demonstrated an understanding of the instructions.   The patient was advised to call back or seek an in-person evaluation if the symptoms worsen or if the condition fails to improve as anticipated.  I provided 16 minutes of non-face-to-face time during this  encounter.   Andrea Co, PA-C

## 2021-06-23 ENCOUNTER — Other Ambulatory Visit: Payer: Self-pay

## 2021-07-03 ENCOUNTER — Telehealth (INDEPENDENT_AMBULATORY_CARE_PROVIDER_SITE_OTHER): Payer: Self-pay

## 2021-07-03 NOTE — Telephone Encounter (Signed)
Called patient with pacific interpreter ID [phone] to discuss medications. She has a voicemail that has not been setup. Maryjean Morn, CMA     Copied from CRM [id number]. Topic: General - Inquiry >> Jun 23, 2021  4:47 PM Daphine Deutscher D wrote: Reason for CRM: Pt called asking about the medications prescribed  the ibuprofen and robaxin  CB# [phone]

## 2021-07-18 ENCOUNTER — Ambulatory Visit: Payer: Self-pay | Admitting: Internal Medicine

## 2021-08-12 ENCOUNTER — Emergency Department (HOSPITAL_COMMUNITY)
Admission: EM | Admit: 2021-08-12 | Discharge: 2021-08-12 | Disposition: A | Discharge status: Home / Self Care | Payer: Self-pay | Attending: Emergency Medicine | Admitting: Emergency Medicine

## 2021-08-12 ENCOUNTER — Emergency Department (HOSPITAL_COMMUNITY): Payer: Self-pay

## 2021-08-12 ENCOUNTER — Encounter (HOSPITAL_COMMUNITY): Payer: Self-pay | Admitting: Emergency Medicine

## 2021-08-12 ENCOUNTER — Other Ambulatory Visit: Payer: Self-pay

## 2021-08-12 DIAGNOSIS — R1012 Left upper quadrant pain: Secondary | ICD-10-CM | POA: Insufficient documentation

## 2021-08-12 DIAGNOSIS — R109 Unspecified abdominal pain: Secondary | ICD-10-CM

## 2021-08-12 DIAGNOSIS — E119 Type 2 diabetes mellitus without complications: Secondary | ICD-10-CM | POA: Insufficient documentation

## 2021-08-12 LAB — COMPREHENSIVE METABOLIC PANEL
ALT: 18 U/L (ref 0–44)
AST: 15 U/L (ref 15–41)
Albumin: 3.8 g/dL (ref 3.5–5.0)
Alkaline Phosphatase: 65 U/L (ref 38–126)
Anion gap: 8 (ref 5–15)
BUN: 13 mg/dL (ref 6–20)
CO2: 25 mmol/L (ref 22–32)
Calcium: 9.3 mg/dL (ref 8.9–10.3)
Chloride: 105 mmol/L (ref 98–111)
Creatinine, Ser: 0.6 mg/dL (ref 0.44–1.00)
GFR, Estimated: >60 mL/min (ref >60)
Glucose, Bld: 86 mg/dL (ref 70–99)
Potassium: 3.9 mmol/L (ref 3.5–5.1)
Sodium: 138 mmol/L (ref 135–145)
Total Bilirubin: 0.4 mg/dL (ref 0.3–1.2)
Total Protein: 7.1 g/dL (ref 6.5–8.1)

## 2021-08-12 LAB — URINALYSIS, ROUTINE W REFLEX MICROSCOPIC
Bilirubin Urine: NEGATIVE
Glucose, UA: NEGATIVE mg/dL
Ketones, ur: 15 mg/dL — AB
Leukocytes,Ua: NEGATIVE
Nitrite: NEGATIVE
Protein, ur: NEGATIVE mg/dL
Specific Gravity, Urine: >1.03 — ABNORMAL HIGH (ref 1.005–1.030)
pH: 5.5 (ref 5.0–8.0)

## 2021-08-12 LAB — LIPASE, BLOOD: Lipase: 30 U/L (ref 11–51)

## 2021-08-12 LAB — CBC
HCT: 40.1 % (ref 36.0–46.0)
Hemoglobin: 13.4 g/dL (ref 12.0–15.0)
MCH: 28.3 pg (ref 26.0–34.0)
MCHC: 33.4 g/dL (ref 30.0–36.0)
MCV: 84.6 fL (ref 80.0–100.0)
Platelets: 215 10*3/uL (ref 150–400)
RBC: 4.74 MIL/uL (ref 3.87–5.11)
RDW: 13.8 % (ref 11.5–15.5)
WBC: 4.9 10*3/uL (ref 4.0–10.5)
nRBC: 0 % (ref 0.0–0.2)

## 2021-08-12 LAB — URINALYSIS, MICROSCOPIC (REFLEX)
Bacteria, UA: NONE SEEN
RBC / HPF: >50 RBC/hpf (ref 0–5)

## 2021-08-12 LAB — I-STAT BETA HCG BLOOD, ED (MC, WL, AP ONLY): I-stat hCG, quantitative: <5 m[IU]/mL (ref <5)

## 2021-08-12 LAB — PREGNANCY, URINE: Preg Test, Ur: NEGATIVE

## 2021-08-12 MED ORDER — ONDANSETRON 4 MG PO TBDP: 4.0000 mg | ORAL_TABLET | Freq: Three times a day (TID) | ORAL | 0 refills | Status: DC | PRN

## 2021-08-12 MED ORDER — SUCRALFATE 1 G PO TABS: 1.0000 g | ORAL_TABLET | Freq: Three times a day (TID) | ORAL | 0 refills | Status: DC

## 2021-08-12 MED ORDER — PANTOPRAZOLE SODIUM 20 MG PO TBEC: 20.0000 mg | DELAYED_RELEASE_TABLET | Freq: Every day | ORAL | 0 refills | Status: DC

## 2021-08-12 NOTE — ED Provider Notes (Signed)
[facility] EMERGENCY DEPARTMENT Provider Note   CSN: [id number] Arrival date & time: 08/12/21  1515     History Chief Complaint  Patient presents with   Flank Pain    Andrea Wise is a 40 y.o. female with past medical history significant for diabetes who presents for evaluation of left flank and abdominal pain.  Intermittent over the last 3 weeks.  Pain worse with eating.  She denies any pain to her epigastric or right upper quadrant.  No dysuria, hematuria.  Denies chance of pregnancy.  No pelvic pain, vaginal discharge or concerns for STDs.  She has no fever, chills, emesis, chest pain, shortness of breath, cough, hemoptysis, unilateral leg swelling, redness or warmth.  No history of PE, DVT.  No recent surgery, mobilization, malignancy or exogenous hormone use.  Has tried ibuprofen and Tylenol at home which helps some however does not completely resolve her pain.  Not worse with movement.  Has PCP appointment scheduled for Monday to follow-up.  States she was seen for this approximately 6 weeks ago and was told she had "gastritis."  She states she did not have any imaging of her abdomen at that time.  She denies any melena or bright blood per rectum.  No chronic NSAID use, EtOH use. Pain was worse on arrival however improved currently. Denies additional aggravating or alleviating factors.  History obtained from patient and past medical records.  Medical Spanish interpreter was used.  HPI     Past Medical History:  Diagnosis Date   Diabetes mellitus without complication (HCC)    No pertinent past medical history     Patient Active Problem List   Diagnosis Date Noted   Influenza vaccine refused 01/25/2021   Type 2 diabetes mellitus with hyperglycemia, without long-term current use of insulin (HCC) 01/25/2021    Past Surgical History:  Procedure Laterality Date   APPENDECTOMY       OB History     Gravida  5   Para  4   Term  3   Preterm   1   AB  1   Living  4      SAB  1   IAB      Ectopic      Multiple      Live Births  1           Family History  Problem Relation Age of Onset   Other Neg Hx     Social History   Tobacco Use   Smoking status: Never   Smokeless tobacco: Never  Substance Use Topics   Alcohol use: No   Drug use: No    Home Medications Prior to Admission medications   Medication Sig Start Date End Date Taking? Authorizing Provider  ondansetron (ZOFRAN ODT) 4 MG disintegrating tablet Take 1 tablet (4 mg total) by mouth every 8 (eight) hours as needed for nausea or vomiting. 08/12/21  Yes Naren Benally A, PA-C  pantoprazole (PROTONIX) 20 MG tablet Take 1 tablet (20 mg total) by mouth daily. 08/12/21  Yes Nikko Quast A, PA-C  sucralfate (CARAFATE) 1 g tablet Take 1 tablet (1 g total) by mouth 4 (four) times daily -  with meals and at bedtime for 14 days. 08/12/21 08/26/21 Yes Naziah Weckerly A, PA-C  ibuprofen (ADVIL) 600 MG tablet Take 1 tablet (600 mg total) by mouth every 6 (six) hours as needed. 06/21/21   Anders Simmonds, PA-C  meloxicam (MOBIC) 15 MG tablet  Take 1 tablet (15 mg total) by mouth daily. Take 1 daily with food. 05/08/21   Arthor CaptainHarris, Abigail, PA-C  methocarbamol (ROBAXIN) 500 MG tablet Take 2 tablets (1,000 mg total) by mouth every 8 (eight) hours as needed for muscle spasms. 06/21/21   Anders SimmondsMcClung, Angela M, PA-C  Multiple Vitamins-Minerals (HAIR SKIN AND NAILS FORMULA PO) Take 1 tablet by mouth daily.    [provider]  omeprazole (PRILOSEC) 20 MG capsule Take 1 capsule (20 mg total) by mouth daily. Patient not taking: Reported on 03/16/2021 02/12/21   Horton, Mayer Maskerourtney F, MD    Allergies    Patient has no known allergies.  Review of Systems   Review of Systems  Constitutional: Negative.   HENT: Negative.    Respiratory: Negative.    Cardiovascular: Negative.   Gastrointestinal:  Positive for abdominal pain and nausea. Negative for abdominal distention,  anal bleeding, blood in stool, constipation, diarrhea, rectal pain and vomiting.  Genitourinary:  Positive for flank pain. Negative for decreased urine volume, difficulty urinating, dysuria, frequency, hematuria, menstrual problem, pelvic pain, urgency, vaginal bleeding, vaginal discharge and vaginal pain.  Musculoskeletal:  Negative for back pain, neck pain and neck stiffness.  Skin: Negative.   Neurological: Negative.   All other systems reviewed and are negative.  Physical Exam Updated Vital Signs BP 99/67   Pulse 63   Temp 98.3 F (36.8 C) (Oral)   Resp 14   SpO2 100%   Physical Exam Vitals and nursing note reviewed.  Constitutional:      General: She is not in acute distress.    Appearance: She is well-developed. She is not ill-appearing, toxic-appearing or diaphoretic.  HENT:     Head: Normocephalic and atraumatic.     Nose: Nose normal.     Mouth/Throat:     Mouth: Mucous membranes are moist.  Eyes:     Pupils: Pupils are equal, round, and reactive to light.  Cardiovascular:     Rate and Rhythm: Normal rate.     Pulses: Normal pulses.     Heart sounds: Normal heart sounds.  Pulmonary:     Effort: Pulmonary effort is normal. No respiratory distress.     Breath sounds: Normal breath sounds.  Chest:     Comments: No chest wall tenderness, crepitus or step-off Abdominal:     General: Bowel sounds are normal. There is no distension.     Palpations: Abdomen is soft. There is no mass.     Tenderness: There is abdominal tenderness. There is no right CVA tenderness, guarding or rebound.     Hernia: No hernia is present.     Comments: Diffuse tenderness to left flank, left abdomen, however negative CVA tap.  Musculoskeletal:        General: Normal range of motion.     Cervical back: Normal range of motion.     Comments: Moves all 4 extremities without difficulty.  Compartments soft.   Skin:    General: Skin is warm and dry.     Capillary Refill: Capillary refill takes  less than 2 seconds.     Comments: No rashes, lesions, edema, erythema or warmth  Neurological:     General: No focal deficit present.     Mental Status: She is alert.  Psychiatric:        Mood and Affect: Mood normal.    ED Results / Procedures / Treatments   Labs (all labs ordered are listed, but only abnormal results are displayed) Labs Reviewed  URINALYSIS, ROUTINE W REFLEX MICROSCOPIC - Abnormal; Notable for the following components:      Result Value   Specific Gravity, Urine >1.030 (*)    Hgb urine dipstick LARGE (*)    Ketones, ur 15 (*)    All other components within normal limits  CBC  COMPREHENSIVE METABOLIC PANEL  LIPASE, BLOOD  PREGNANCY, URINE  URINALYSIS, MICROSCOPIC (REFLEX)  URINALYSIS, ROUTINE W REFLEX MICROSCOPIC  I-STAT BETA HCG BLOOD, ED ([facility], [facility], [facility] ONLY)    EKG None  Radiology CT Renal Stone Study  Result Date: 08/12/2021 CLINICAL DATA:  Intermittent left flank pain for 2 weeks EXAM: CT ABDOMEN AND PELVIS WITHOUT CONTRAST TECHNIQUE: Multidetector CT imaging of the abdomen and pelvis was performed following the standard protocol without IV contrast. Unenhanced CT was performed per clinician order. Lack of IV contrast limits sensitivity and specificity, especially for evaluation of abdominal/pelvic solid viscera. COMPARISON:  None. FINDINGS: Lower chest: No acute pleural or parenchymal lung disease. Hepatobiliary: 13 mm cholesterol gallstone is identified dependently within the gallbladder. No evidence of acute cholecystitis. Unenhanced imaging of the liver is unremarkable. Pancreas: Unremarkable. No pancreatic ductal dilatation or surrounding inflammatory changes. Spleen: Normal in size without focal abnormality. Adrenals/Urinary Tract: There is a 3 mm nonobstructing calculus within the lower pole left kidney. No right-sided calculi. The adrenals and bladder are unremarkable. Stomach/Bowel: No bowel obstruction or ileus. No bowel wall thickening or inflammatory  change. Vascular/Lymphatic: No significant vascular findings are present. No enlarged abdominal or pelvic lymph nodes. Reproductive: Uterus and bilateral adnexa are unremarkable. Other: No free fluid or free gas.  No abdominal wall hernia. Musculoskeletal: No acute or destructive bony lesions. Reconstructed images demonstrate no additional findings. IMPRESSION: 1. Nonobstructing 3 mm left renal calculus. 2. Cholelithiasis without acute cholecystitis. Electronically Signed   By: Sharlet Salina M.D.   On: 08/12/2021 22:11    Procedures Procedures   Medications Ordered in ED Medications - No data to display  ED Course  I have reviewed the triage vital signs and the nursing notes.  Pertinent labs & imaging results that were available during my care of the patient were reviewed by me and considered in my medical decision making (see chart for details).  40 year old here for evaluation of intermittent left flank and abdominal pain over the last 2 to 3 weeks.  She is afebrile, nonseptic, not ill-appearing.  Reproducible pain on exam.  She denies any urinary complaints, pelvic pain, concerns for any STDs.  Apparently has had something similar a few weeks ago however did not have CT scan was diagnosed with gastroenteritis at that time.  She has no chest pain, shortness of breath, no clinical evidence of VTE on exam.  She is PERC negative, Wells criteria low risk.  I have low suspicion for acute intrathoracic etiology as cause of her pain.   Work-up started from triage today personally reviewed and interpreted:  CBC without leukocytosis mild metabolic panel without electrolyte, renal or liver abnormality Lipase 30 UA negative for infection however does have large blood Pregnancy test negative  Given hematuria, not on menstrual cycle will obtain CT scan to look for stone.  She does not want anything for pain at this time  CT with renal stone however no acute abnormality.  Discussed with patient.   Unclear etiology of her symptoms, given hematuria, worsening pain earlier, significantly improved previously wonder if patient recently passed stone.  With regards to her pain worse when she eats she has a negative Murphy sign.  She does have gallstones on her imaging however no fluid collection, wall thickening to suggest cholecystitis.  I discussed with patient follow-up with PCP for further management of this, she is tolerating p.o. intake here currently.  Patient is nontoxic, nonseptic appearing, in no apparent distress.  Patient's pain and other symptoms adequately managed in emergency department.  Fluid bolus given.  Labs, imaging and vitals reviewed.  Patient does not meet the SIRS or Sepsis criteria.  On repeat exam patient does not have a surgical abdomin and there are no peritoneal signs.  No indication of appendicitis, bowel obstruction, bowel perforation, cholecystitis, diverticulitis, PID or ectopic pregnancy.  Patient discharged home with symptomatic treatment and given strict instructions for follow-up with their primary care physician.  I have also discussed reasons to return immediately to the ER.  Patient expresses understanding and agrees with plan.      MDM Rules/Calculators/A&P                            Final Clinical Impression(s) / ED Diagnoses Final diagnoses:  Left upper quadrant abdominal pain  Flank pain    Rx / DC Orders ED Discharge Orders          Ordered    ondansetron (ZOFRAN ODT) 4 MG disintegrating tablet  Every 8 hours PRN        08/12/21 2303    sucralfate (CARAFATE) 1 g tablet  3 times daily with meals & bedtime        08/12/21 2303    pantoprazole (PROTONIX) 20 MG tablet  Daily        08/12/21 2303             Kharon Hixon A, PA-C 08/12/21 2307    Pricilla Loveless, MD 08/12/21 [phone]

## 2021-08-12 NOTE — ED Triage Notes (Signed)
Pt coming from home, complaint of flank pain x2 weeks. VSS. NAD.

## 2021-08-12 NOTE — ED Notes (Signed)
Patient transported to CT 

## 2021-08-12 NOTE — Discharge Instructions (Signed)
Tome los medicamentos segn lo prescrito.  Regresar por sntomas nuevos o que empeoran 

## 2021-08-12 NOTE — ED Notes (Signed)
Returned from Ct scan  

## 2021-08-12 NOTE — ED Provider Notes (Addendum)
Emergency Medicine Provider Triage Evaluation Note  Andrea Wise , a 40 y.o. female  was evaluated in triage.  Pt complains of left flank pain that is been ongoing for the past 2 weeks.  Minimal relief with any medication over-the-counter.  Does report some radiation of the pain to the left lower quadrant.  No prior history of pancreatitis.  Does have prior history of appendectomy noted.  Although in the past she had a gastritis, pain does wax and wane with eating.  PCP appointment scheduled for Monday.  Review of Systems  Positive: Left flank pain, nausea Negative: Urinary symptoms,vaginal bleeding, shortness of breath, cough  Physical Exam  BP 106/70 (BP Location: Right Arm)   Pulse 66   Temp 98.2 F (36.8 C) (Oral)   Resp 16   SpO2 99%  Gen:   Awake, no distress   Resp:  Normal effort  MSK:   Moves extremities without difficulty  Other:    Medical Decision Making  Medically screening exam initiated at 4:39 PM.  Appropriate orders placed.  Andrea Wise was informed that the remainder of the evaluation will be completed by another provider, this initial triage assessment does not replace that evaluation, and the importance of remaining in the ED until their evaluation is complete.  Patient here with left flank pain for the past 2 weeks no relief with over-the-counter medication.  No prior history of alcohol abuse or pancreatitis.  No urinary symptoms.  Labs have been ordered.LMP today    Andrea Manges, PA-C 08/12/21 1643    Andrea Manges, PA-C 08/12/21 1644    Maia Plan, MD 08/20/21 1756

## 2021-09-04 ENCOUNTER — Ambulatory Visit: Payer: Self-pay | Admitting: Nurse Practitioner

## 2021-09-27 ENCOUNTER — Emergency Department (HOSPITAL_COMMUNITY)
Admission: EM | Admit: 2021-09-27 | Discharge: 2021-09-27 | Disposition: A | Discharge status: Home / Self Care | Payer: Self-pay | Attending: Emergency Medicine | Admitting: Emergency Medicine

## 2021-09-27 ENCOUNTER — Other Ambulatory Visit: Payer: Self-pay

## 2021-09-27 DIAGNOSIS — E876 Hypokalemia: Secondary | ICD-10-CM | POA: Insufficient documentation

## 2021-09-27 DIAGNOSIS — E119 Type 2 diabetes mellitus without complications: Secondary | ICD-10-CM | POA: Insufficient documentation

## 2021-09-27 DIAGNOSIS — J101 Influenza due to other identified influenza virus with other respiratory manifestations: Secondary | ICD-10-CM | POA: Insufficient documentation

## 2021-09-27 DIAGNOSIS — Z20822 Contact with and (suspected) exposure to covid-19: Secondary | ICD-10-CM | POA: Insufficient documentation

## 2021-09-27 LAB — I-STAT BETA HCG BLOOD, ED (MC, WL, AP ONLY): I-stat hCG, quantitative: <5 m[IU]/mL (ref <5)

## 2021-09-27 LAB — CBC WITH DIFFERENTIAL/PLATELET
Abs Immature Granulocytes: 0.01 10*3/uL (ref 0.00–0.07)
Basophils Absolute: 0 10*3/uL (ref 0.0–0.1)
Basophils Relative: 1 %
Eosinophils Absolute: 0 10*3/uL (ref 0.0–0.5)
Eosinophils Relative: 0 %
HCT: 40.1 % (ref 36.0–46.0)
Hemoglobin: 13.6 g/dL (ref 12.0–15.0)
Immature Granulocytes: 0 %
Lymphocytes Relative: 24 %
Lymphs Abs: 0.9 10*3/uL (ref 0.7–4.0)
MCH: 27.9 pg (ref 26.0–34.0)
MCHC: 33.9 g/dL (ref 30.0–36.0)
MCV: 82.2 fL (ref 80.0–100.0)
Monocytes Absolute: 0.5 10*3/uL (ref 0.1–1.0)
Monocytes Relative: 11 %
Neutro Abs: 2.5 10*3/uL (ref 1.7–7.7)
Neutrophils Relative %: 64 %
Platelets: 176 10*3/uL (ref 150–400)
RBC: 4.88 MIL/uL (ref 3.87–5.11)
RDW: 14.1 % (ref 11.5–15.5)
WBC: 4 10*3/uL (ref 4.0–10.5)
nRBC: 0 % (ref 0.0–0.2)

## 2021-09-27 LAB — COMPREHENSIVE METABOLIC PANEL
ALT: 19 U/L (ref 0–44)
AST: 20 U/L (ref 15–41)
Albumin: 3.7 g/dL (ref 3.5–5.0)
Alkaline Phosphatase: 61 U/L (ref 38–126)
Anion gap: 10 (ref 5–15)
BUN: 10 mg/dL (ref 6–20)
CO2: 22 mmol/L (ref 22–32)
Calcium: 9 mg/dL (ref 8.9–10.3)
Chloride: 103 mmol/L (ref 98–111)
Creatinine, Ser: 0.61 mg/dL (ref 0.44–1.00)
GFR, Estimated: >60 mL/min (ref >60)
Glucose, Bld: 93 mg/dL (ref 70–99)
Potassium: 3.1 mmol/L — ABNORMAL LOW (ref 3.5–5.1)
Sodium: 135 mmol/L (ref 135–145)
Total Bilirubin: 0.5 mg/dL (ref 0.3–1.2)
Total Protein: 7.2 g/dL (ref 6.5–8.1)

## 2021-09-27 LAB — URINALYSIS, ROUTINE W REFLEX MICROSCOPIC
Bacteria, UA: NONE SEEN
Bilirubin Urine: NEGATIVE
Glucose, UA: NEGATIVE mg/dL
Hgb urine dipstick: NEGATIVE
Ketones, ur: 80 mg/dL — AB
Nitrite: NEGATIVE
Protein, ur: NEGATIVE mg/dL
Specific Gravity, Urine: 1.013 (ref 1.005–1.030)
pH: 5 (ref 5.0–8.0)

## 2021-09-27 LAB — RESP PANEL BY RT-PCR (FLU A&B, COVID) ARPGX2
Influenza A by PCR: POSITIVE — AB
Influenza B by PCR: NEGATIVE
SARS Coronavirus 2 by RT PCR: NEGATIVE

## 2021-09-27 LAB — TSH: TSH: 2.74 u[IU]/mL (ref 0.350–4.500)

## 2021-09-27 MED ORDER — ONDANSETRON 4 MG PO TBDP: 4.0000 mg | ORAL_TABLET | Freq: Three times a day (TID) | ORAL | 1 refills | Status: DC | PRN

## 2021-09-27 MED ORDER — POTASSIUM CHLORIDE CRYS ER 20 MEQ PO TBCR: 20.0000 meq | EXTENDED_RELEASE_TABLET | Freq: Two times a day (BID) | ORAL | 0 refills | Status: DC

## 2021-09-27 NOTE — ED Provider Notes (Signed)
Emergency Medicine Provider Triage Evaluation Note  Andrea Wise , a 40 y.o. female  was evaluated in triage.  Pt with multiple complaints. Has f/u scheduled with a PCP tomorrow. Came to the ED tonight for worsening hot flashes that originate in her occiput. Notes associated fever on Sunday for which she took tylenol. Patient reporting some pain diffusely across her back. She has noted her urine to be more yellow in color. Has been having pain in her b/l forearm and hands, but this has been ongoing x 2 months.  Review of Systems  Positive: As above Negative: As above  Physical Exam  BP 107/65 (BP Location: Right Arm)   Pulse 82   Temp 98.6 F (37 C) (Oral)   Resp 18   Ht 5\' 2"  (1.575 m)   Wt 62.1 kg   SpO2 100%   BMI 25.06 kg/m  Gen:   Awake, no distress   Resp:  Normal effort  MSK:   Moves extremities without difficulty  Other:  Mild, dry cough  Medical Decision Making  Medically screening exam initiated at 1:52 AM.  Appropriate orders placed.  Andrea Wise was informed that the remainder of the evaluation will be completed by another provider, this initial triage assessment does not replace that evaluation, and the importance of remaining in the ED until their evaluation is complete.  Multiple complaints   Andrea Wise, Andrea Wise 09/27/21 0154    09/29/21, MD 09/27/21 [phone]

## 2021-09-27 NOTE — ED Triage Notes (Addendum)
Pt c/o hot flashes, fevers, and NVD. Vomiting and nausea has now resolved. Pt states left arm numbness and tingling in her chest along with right side flank pain.Symptoms onset on Sunday.  Per note pts children were seen for similar symptoms. DX with viral infections

## 2021-09-27 NOTE — ED Provider Notes (Signed)
[facility] EMERGENCY DEPARTMENT Provider Note   CSN: [id number] Arrival date & time: 09/27/21  0008     History Chief Complaint  Patient presents with   Multiple Complaints    Andrea Wise Andrea Wise is a 40 y.o. female.  Patient with 3-day history of flulike symptoms.  Including hot flashes fevers nausea vomiting and diarrhea.  Than the vomiting and diarrhea has resolved.  As of yesterday.  Patient has several family members with similar symptoms they were diagnosed with flu.  Being treated symptomatically.  Patient's onset of symptoms were on Sunday.  Patient has not had the flu vaccine.  Patient's past medical history significant for type 2 diabetes.  Patient nontoxic in appearance.  Patient's daughter was used as an Equities trader.  She did very well.  Our iPad would not function properly.  Patient denies sore throat.  But has cough body aches fever feeling as well as the nausea vomiting and diarrhea.      Past Medical History:  Diagnosis Date   Diabetes mellitus without complication (HCC)    No pertinent past medical history     Patient Active Problem List   Diagnosis Date Noted   Influenza vaccine refused 01/25/2021   Type 2 diabetes mellitus with hyperglycemia, without long-term current use of insulin (HCC) 01/25/2021    Past Surgical History:  Procedure Laterality Date   APPENDECTOMY       OB History     Gravida  5   Para  4   Term  3   Preterm  1   AB  1   Living  4      SAB  1   IAB      Ectopic      Multiple      Live Births  1           Family History  Problem Relation Age of Onset   Other Neg Hx     Social History   Tobacco Use   Smoking status: Never   Smokeless tobacco: Never  Substance Use Topics   Alcohol use: No   Drug use: No    Home Medications Prior to Admission medications   Medication Sig Start Date End Date Taking? Authorizing Provider  ibuprofen (ADVIL) 600 MG tablet Take 1 tablet (600  mg total) by mouth every 6 (six) hours as needed. 06/21/21   Anders Simmonds, PA-C  meloxicam (MOBIC) 15 MG tablet Take 1 tablet (15 mg total) by mouth daily. Take 1 daily with food. 05/08/21   Arthor Captain, PA-C  methocarbamol (ROBAXIN) 500 MG tablet Take 2 tablets (1,000 mg total) by mouth every 8 (eight) hours as needed for muscle spasms. 06/21/21   Anders Simmonds, PA-C  Multiple Vitamins-Minerals (HAIR SKIN AND NAILS FORMULA PO) Take 1 tablet by mouth daily.    [provider]  omeprazole (PRILOSEC) 20 MG capsule Take 1 capsule (20 mg total) by mouth daily. Patient not taking: Reported on 03/16/2021 02/12/21   Horton, Mayer Masker, MD  ondansetron (ZOFRAN ODT) 4 MG disintegrating tablet Take 1 tablet (4 mg total) by mouth every 8 (eight) hours as needed for nausea or vomiting. 08/12/21   Henderly, Britni A, PA-C  pantoprazole (PROTONIX) 20 MG tablet Take 1 tablet (20 mg total) by mouth daily. 08/12/21   Henderly, Britni A, PA-C  sucralfate (CARAFATE) 1 g tablet Take 1 tablet (1 g total) by mouth 4 (four) times daily -  with meals and at  bedtime for 14 days. 08/12/21 08/26/21  Henderly, Britni A, PA-C    Allergies    Patient has no known allergies.  Review of Systems   Review of Systems  Constitutional:  Positive for fever. Negative for chills.  HENT:  Negative for ear pain and sore throat.   Eyes:  Negative for pain and visual disturbance.  Respiratory:  Positive for cough. Negative for shortness of breath.   Cardiovascular:  Negative for chest pain and palpitations.  Gastrointestinal:  Positive for diarrhea, nausea and vomiting. Negative for abdominal pain.  Genitourinary:  Negative for dysuria and hematuria.  Musculoskeletal:  Positive for myalgias. Negative for arthralgias and back pain.  Skin:  Negative for color change and rash.  Neurological:  Negative for seizures and syncope.  All other systems reviewed and are negative.  Physical Exam Updated Vital Signs BP 110/69    Pulse 81   Temp 98.5 F (36.9 C) (Oral)   Resp 15   Ht 1.575 m (5\' 2" )   Wt 62.1 kg   SpO2 98%   BMI 25.06 kg/m   Physical Exam Vitals and nursing note reviewed.  Constitutional:      General: She is not in acute distress.    Appearance: Normal appearance. She is well-developed. She is not ill-appearing, toxic-appearing or diaphoretic.  HENT:     Head: Normocephalic and atraumatic.  Eyes:     General: No scleral icterus.    Extraocular Movements: Extraocular movements intact.     Conjunctiva/sclera: Conjunctivae normal.     Pupils: Pupils are equal, round, and reactive to light.  Cardiovascular:     Rate and Rhythm: Normal rate and regular rhythm.     Heart sounds: No murmur heard. Pulmonary:     Effort: Pulmonary effort is normal. No respiratory distress.     Breath sounds: Rhonchi present.  Abdominal:     Palpations: Abdomen is soft.     Tenderness: There is no abdominal tenderness.  Musculoskeletal:        General: No swelling. Normal range of motion.     Cervical back: Normal range of motion and neck supple. No rigidity.  Skin:    General: Skin is warm and dry.     Capillary Refill: Capillary refill takes less than 2 seconds.  Neurological:     General: No focal deficit present.     Mental Status: She is alert and oriented to person, place, and time.     Cranial Nerves: No cranial nerve deficit.     Sensory: No sensory deficit.     Motor: No weakness.    ED Results / Procedures / Treatments   Labs (all labs ordered are listed, but only abnormal results are displayed) Labs Reviewed  RESP PANEL BY RT-PCR (FLU A&B, COVID) ARPGX2 - Abnormal; Notable for the following components:      Result Value   Influenza A by PCR POSITIVE (*)    All other components within normal limits  URINALYSIS, ROUTINE W REFLEX MICROSCOPIC - Abnormal; Notable for the following components:   APPearance HAZY (*)    Ketones, ur 80 (*)    Leukocytes,Ua TRACE (*)    All other components  within normal limits  COMPREHENSIVE METABOLIC PANEL - Abnormal; Notable for the following components:   Potassium 3.1 (*)    All other components within normal limits  CBC WITH DIFFERENTIAL/PLATELET  TSH  I-STAT BETA HCG BLOOD, ED ([facility], [facility], [facility] ONLY)    EKG None  Radiology No results  found.  Procedures Procedures   Medications Ordered in ED Medications - No data to display  ED Course  I have reviewed the triage vital signs and the nursing notes.  Pertinent labs & imaging results that were available during my care of the patient were reviewed by me and considered in my medical decision making (see chart for details).    MDM Rules/Calculators/A&P                           Patient's labs significant for mild hypokalemia.  With potassium of 3.1.  Probably secondary to her nausea vomiting and diarrhea.  Influenza A is positive sounds like other family members also tested positive for influenza A.  Including her children.  Patient will be treated symptomatically.  Out of the window for Tamiflu being very effective.  Vital signs very normal.  Oxygen saturations normal.  Patient nontoxic no acute distress.  For the potassium being low patient patient will be treated with oral potassium supplement.  Patient's renal function was normal.  Pregnancy to this negative.   Final Clinical Impression(s) / ED Diagnoses Final diagnoses:  Influenza A  Hypokalemia    Rx / DC Orders ED Discharge Orders     None        Vanetta Mulders, MD 09/27/21 [phone]

## 2021-09-27 NOTE — Discharge Instructions (Signed)
Take the Zofran as needed for nausea and vomiting.  Take the potassium supplement as directed for the next couple days.  Recommend Tylenol or Motrin for the hot feelings.  And the body aches.  Recommend over-the-counter cold and flu medicine something like Mucinex DM for the cough.

## 2021-12-14 ENCOUNTER — Other Ambulatory Visit: Payer: Self-pay

## 2021-12-14 ENCOUNTER — Emergency Department (HOSPITAL_COMMUNITY)
Admission: EM | Admit: 2021-12-14 | Discharge: 2021-12-15 | Disposition: A | Discharge status: Home / Self Care | Payer: Self-pay | Attending: Emergency Medicine | Admitting: Emergency Medicine

## 2021-12-14 ENCOUNTER — Encounter (HOSPITAL_COMMUNITY): Payer: Self-pay

## 2021-12-14 ENCOUNTER — Emergency Department (HOSPITAL_COMMUNITY): Payer: Self-pay

## 2021-12-14 DIAGNOSIS — Z20822 Contact with and (suspected) exposure to covid-19: Secondary | ICD-10-CM | POA: Insufficient documentation

## 2021-12-14 DIAGNOSIS — H9209 Otalgia, unspecified ear: Secondary | ICD-10-CM | POA: Insufficient documentation

## 2021-12-14 DIAGNOSIS — R519 Headache, unspecified: Secondary | ICD-10-CM | POA: Insufficient documentation

## 2021-12-14 DIAGNOSIS — J189 Pneumonia, unspecified organism: Secondary | ICD-10-CM | POA: Insufficient documentation

## 2021-12-14 DIAGNOSIS — J029 Acute pharyngitis, unspecified: Secondary | ICD-10-CM | POA: Insufficient documentation

## 2021-12-14 LAB — RESP PANEL BY RT-PCR (FLU A&B, COVID) ARPGX2
Influenza A by PCR: NEGATIVE
Influenza B by PCR: NEGATIVE
SARS Coronavirus 2 by RT PCR: NEGATIVE

## 2021-12-14 NOTE — ED Provider Triage Note (Signed)
Emergency Medicine Provider Triage Evaluation Note  Andrea Wise , a 41 y.o. female  was evaluated in triage.  Pt complains of sore throat, cough for 2 weeks.  Symptoms have stayed about the same.  She has not gotten any better.  She complains of right earache as well as chest tightness.  Review of Systems  Positive:  Negative:   Physical Exam  BP 115/73 (BP Location: Left Arm)    Pulse 70    Temp 98.3 F (36.8 C) (Oral)    Resp 16    Ht 5\' 2"  (1.575 m)    Wt 59 kg    SpO2 100%    BMI 23.78 kg/m  Gen:   Awake, no distress   Resp:  Normal effort  MSK:   Moves extremities without difficulty  Other:  No PTA or tonsillar swelling evident on exam  Medical Decision Making  Medically screening exam initiated at 5:29 PM.  Appropriate orders placed.  Finnleigh Marchetti was informed that the remainder of the evaluation will be completed by another provider, this initial triage assessment does not replace that evaluation, and the importance of remaining in the ED until their evaluation is complete.     [name], PA-C 12/14/21 1730

## 2021-12-14 NOTE — ED Triage Notes (Signed)
Pt arrived POV from home c/o a headache, ear ache, sore throat and chest pain. Pt states these symptoms have been bothering her for 2 weeks.

## 2021-12-15 MED ORDER — DOXYCYCLINE HYCLATE 100 MG PO CAPS: 100.0000 mg | ORAL_CAPSULE | Freq: Two times a day (BID) | ORAL | 0 refills | Status: DC

## 2021-12-15 MED ORDER — AMOXICILLIN-POT CLAVULANATE 875-125 MG PO TABS: 1.0000 | ORAL_TABLET | Freq: Two times a day (BID) | ORAL | 0 refills | Status: DC

## 2021-12-15 NOTE — Discharge Instructions (Signed)
Toma los antibioticos.  Da Neomia Dear cita con la clinica debajo.  Regresa a la sala de emergencia si los sintomas [facility]

## 2021-12-15 NOTE — ED Provider Notes (Signed)
[facility] EMERGENCY DEPARTMENT Provider Note   CSN: [id number] Arrival date & time: 12/14/21  1625     History  Chief Complaint  Patient presents with   Headache   Otalgia   Sore Throat    Andrea Wise Nechama Escutia is a 41 y.o. female presenting for evaluation of cough and sore throat.  Symptoms have been there for about 2 weeks.  She denies fever.  No difficulty breathing or chest pain.  She does feel like there is something moving in her chest.  She is not had this evaluated.  She does report a history of diabetes, used to be on pills but is no longer.  No other medical problems.  No sick contacts.  HPI     Home Medications Prior to Admission medications   Medication Sig Start Date End Date Taking? Authorizing Provider  amoxicillin-clavulanate (AUGMENTIN) 875-125 MG tablet Take 1 tablet by mouth every 12 (twelve) hours. 12/15/21  Yes Shonette Rhames, PA-C  doxycycline (VIBRAMYCIN) 100 MG capsule Take 1 capsule (100 mg total) by mouth 2 (two) times daily. 12/15/21  Yes Cayman Kielbasa, PA-C  ibuprofen (ADVIL) 600 MG tablet Take 1 tablet (600 mg total) by mouth every 6 (six) hours as needed. 06/21/21   Anders Simmonds, PA-C  meloxicam (MOBIC) 15 MG tablet Take 1 tablet (15 mg total) by mouth daily. Take 1 daily with food. 05/08/21   Arthor Captain, PA-C  methocarbamol (ROBAXIN) 500 MG tablet Take 2 tablets (1,000 mg total) by mouth every 8 (eight) hours as needed for muscle spasms. 06/21/21   Anders Simmonds, PA-C  Multiple Vitamins-Minerals (HAIR SKIN AND NAILS FORMULA PO) Take 1 tablet by mouth daily.    [provider]  omeprazole (PRILOSEC) 20 MG capsule Take 1 capsule (20 mg total) by mouth daily. Patient not taking: Reported on 03/16/2021 02/12/21   Horton, Mayer Masker, MD  ondansetron (ZOFRAN ODT) 4 MG disintegrating tablet Take 1 tablet (4 mg total) by mouth every 8 (eight) hours as needed for nausea or vomiting. 08/12/21   Henderly, Britni A,  PA-C  ondansetron (ZOFRAN ODT) 4 MG disintegrating tablet Take 1 tablet (4 mg total) by mouth every 8 (eight) hours as needed for nausea or vomiting. 09/27/21   Vanetta Mulders, MD  pantoprazole (PROTONIX) 20 MG tablet Take 1 tablet (20 mg total) by mouth daily. 08/12/21   Henderly, Britni A, PA-C  potassium chloride SA (KLOR-CON) 20 MEQ tablet Take 1 tablet (20 mEq total) by mouth 2 (two) times daily. 09/27/21   Vanetta Mulders, MD  sucralfate (CARAFATE) 1 g tablet Take 1 tablet (1 g total) by mouth 4 (four) times daily -  with meals and at bedtime for 14 days. 08/12/21 08/26/21  Henderly, Britni A, PA-C      Allergies    Patient has no known allergies.    Review of Systems   Review of Systems  HENT:  Positive for sore throat.   Respiratory:  Positive for cough.   All other systems reviewed and are negative.  Physical Exam Updated Vital Signs BP 116/79 (BP Location: Right Arm)    Pulse 69    Temp 98.2 F (36.8 C) (Oral)    Resp 15    Ht 5\' 2"  (1.575 m)    Wt 59 kg    SpO2 97%    BMI 23.78 kg/m  Physical Exam Vitals and nursing note reviewed.  Constitutional:      General: She is not in acute  distress.    Appearance: Normal appearance.     Comments: Resting in the bed in NAD  HENT:     Head: Normocephalic and atraumatic.     Mouth/Throat:     Pharynx: Oropharynx is clear. No posterior oropharyngeal erythema.  Eyes:     Conjunctiva/sclera: Conjunctivae normal.     Pupils: Pupils are equal, round, and reactive to light.  Cardiovascular:     Rate and Rhythm: Normal rate and regular rhythm.     Pulses: Normal pulses.  Pulmonary:     Effort: Pulmonary effort is normal. No respiratory distress.     Breath sounds: Normal breath sounds. No wheezing.     Comments: Speaking in full sentences.  Clear lung sounds in all fields. SpO2 stable on RA Abdominal:     General: There is no distension.     Palpations: Abdomen is soft.     Tenderness: There is no abdominal tenderness.   Musculoskeletal:        General: Normal range of motion.     Cervical back: Normal range of motion and neck supple.  Skin:    General: Skin is warm and dry.     Capillary Refill: Capillary refill takes less than 2 seconds.  Neurological:     Mental Status: She is alert and oriented to person, place, and time.  Psychiatric:        Mood and Affect: Mood and affect normal.        Speech: Speech normal.        Behavior: Behavior normal.    ED Results / Procedures / Treatments   Labs (all labs ordered are listed, but only abnormal results are displayed) Labs Reviewed  RESP PANEL BY RT-PCR (FLU A&B, COVID) ARPGX2    EKG None  Radiology DG Chest 2 View  Result Date: 12/14/2021 CLINICAL DATA:  Chest pain, pneumonia EXAM: CHEST - 2 VIEW COMPARISON:  Radiograph 06/09/2021 FINDINGS: Unchanged cardiomediastinal silhouette. There is no focal airspace consolidation. There is mild diffuse reticulation. There is no large pleural effusion. There is no visible pneumothorax. There is no acute osseous abnormality. IMPRESSION: Mild diffuse reticular opacities, concerning for possible atypical infection/inflammation. No focal consolidation. Electronically Signed   By: Caprice Renshaw M.D.   On: 12/14/2021 18:12    Procedures Procedures    Medications Ordered in ED Medications - No data to display  ED Course/ Medical Decision Making/ A&P                           Medical Decision Making   This patient presents to the ED for concern of cough and st. This involves an extensive number of treatment options, and is a complaint that carries with it a moderate risk of complications and morbidity.  The differential diagnosis includes viral illness, pneumonia, ACS, PE.   Co morbidities:  DM    Lab Tests:  Labs obtained from triage interpreted by me.  COVID and flu negative.  EKG normal sinus rhythm.   Imaging Studies: Chest x-ray obtained from triage viewed and independently interpreted by me,  shows haziness most consistent with pneumonia.  Per radiology, most consistent with atypical infection.   Dispostion:  After consideration of the diagnostic results and the patients response to treatment, I feel that the patent would benefit from outpatient treatment with antibiotics and PCP follow-up.  Discussed findings with patient.  Discussed finding of pneumonia.  Discussed negative viral tests and reassuring EKG.  As patient is not having any chest pain, doubt ACS.  Will use doxycycline as part of the treatment for pneumonia to cover for more atypical infections.  Social work consult placed to assist with PCP follow-up, patient given information for [facility] and wellness.  As patient's sats are reassuring and pulmonary exam is overall reassuring, do not feel she needs admission for her pneumonia at this time.  At this time, patient appears safe for discharge.  Return precautions given.  Patient and husband state they understand and agree to plan  Final Clinical Impression(s) / ED Diagnoses Final diagnoses:  Community acquired pneumonia, unspecified laterality    Rx / DC Orders ED Discharge Orders          Ordered    amoxicillin-clavulanate (AUGMENTIN) 875-125 MG tablet  Every 12 hours        12/15/21 0348    doxycycline (VIBRAMYCIN) 100 MG capsule  2 times daily        12/15/21 0348              Alveria ApleyCaccavale, Nyiah Pianka, PA-C 12/15/21 0408    Zadie RhineWickline, Donald, MD 12/16/21 [phone]

## 2022-01-21 ENCOUNTER — Inpatient Hospital Stay (HOSPITAL_COMMUNITY)
Admission: AD | Admit: 2022-01-21 | Discharge: 2022-01-21 | Disposition: A | Discharge status: Home / Self Care | Payer: Self-pay | Attending: Obstetrics and Gynecology | Admitting: Obstetrics and Gynecology

## 2022-01-21 ENCOUNTER — Other Ambulatory Visit: Payer: Self-pay

## 2022-01-21 DIAGNOSIS — N644 Mastodynia: Secondary | ICD-10-CM | POA: Insufficient documentation

## 2022-01-21 DIAGNOSIS — Z789 Other specified health status: Secondary | ICD-10-CM

## 2022-01-21 NOTE — MAU Note (Signed)
Pt reports to mau with c/o right breast pain.  Pt denies pregnancy.

## 2022-01-21 NOTE — MAU Provider Note (Signed)
Event Date/Time   First Provider Initiated Contact with Patient 01/21/22 Andrea Wise is a 41 y.o. [phone] patient who presents to MAU today with complaint of bilateral breast pain. She reports that the pain started a few days ago and she rates the pain 5/10. She has tried tylenol and ibuprofen, and reports that it helps until for a short time. She has an appt with her PCP on 02/07/2022 and she had a mammogram at [place] a few weeks ago. Results are not in epic, but she has not heard of any abnormality.  She reports that she had some red streaks on her breast, but those have resolved. She is not currently breastfeeding, and she denies any fever/chills.   O BP 125/61 (BP Location: Right Arm)    Pulse 72    Temp 98 F (36.7 C) (Oral)    Resp 16    SpO2 99%  Physical Exam Vitals and nursing note reviewed. Exam conducted with a chaperone present.  Constitutional:      General: She is not in acute distress. HENT:     Head: Normocephalic.  Eyes:     Pupils: Pupils are equal, round, and reactive to light.  Cardiovascular:     Rate and Rhythm: Normal rate.  Pulmonary:     Effort: Pulmonary effort is normal.  Neurological:     Mental Status: She is alert and oriented to person, place, and time.  Psychiatric:        Mood and Affect: Mood normal.        Behavior: Behavior normal.    A Medical screening exam complete 1. Pain of both breasts   2. Not currently pregnant      P Discharge from MAU in stable condition Patient given the option of transfer to [facility] for further evaluation or seek care in outpatient facility of choice  List of options for follow-up given, and patient would like to try urgent care Warning signs for worsening condition that would warrant emergency follow-up discussed In person spanish interpretor used for this encounter.   Marcille Buffy DNP, CNM  01/21/22  6:49 PM

## 2022-02-28 ENCOUNTER — Ambulatory Visit (INDEPENDENT_AMBULATORY_CARE_PROVIDER_SITE_OTHER): Payer: Self-pay | Admitting: Primary Care

## 2022-12-06 ENCOUNTER — Other Ambulatory Visit (HOSPITAL_COMMUNITY): Payer: Self-pay

## 2023-03-28 ENCOUNTER — Ambulatory Visit: Payer: Self-pay | Admitting: Hematology and Oncology

## 2023-03-28 VITALS — BP 110/59 | Wt 152.7 lb

## 2023-03-28 DIAGNOSIS — Z1231 Encounter for screening mammogram for malignant neoplasm of breast: Secondary | ICD-10-CM

## 2023-03-28 NOTE — Progress Notes (Signed)
Ms. Andrea Wise is a 42 y.o. female who presents to [facility] clinic today with no complaints.    Pap Smear: Pap not smear completed today. Last Pap smear was 2021 at [facility] clinic and was normal. Per patient has no history of an abnormal Pap smear. Last Pap smear result is not available in Epic.   Physical exam: Breasts Breasts symmetrical. No skin abnormalities bilateral breasts. No nipple retraction bilateral breasts. No nipple discharge bilateral breasts. No lymphadenopathy. No lumps palpated bilateral breasts.       Pelvic/Bimanual Pap is not indicated today    Smoking History: Patient has never smoked and was not referred to quit line.    Patient Navigation: Patient education provided. Access to services provided for patient through [facility] program. No interpreter provided. No transportation provided   Colorectal Cancer Screening: Per patient has never had colonoscopy completed No complaints today.    Breast and Cervical Cancer Risk Assessment: Patient does not have family history of breast cancer, known genetic mutations, or radiation treatment to the chest before age 26. Patient does not have history of cervical dysplasia, immunocompromised, or DES exposure in-utero.  Risk Scores as of 03/28/2023     Andrea Wise           5-year 0.4 %   Lifetime 7.33 %   This patient is His[place]/Latina but has no documented birth country, so the [place] model used data from [place] patients to calculate their risk score. Document a birth country in the Demographics activity for a more accurate score.         Last calculated by Caprice Red, CMA on 03/28/2023 at 12:44 PM        A: [facility] exam without pap smear No complaints with benign exam.   P: Referred patient to the [facility] for a screening mammogram. Appointment scheduled 03/28/23.  [name], NP 03/28/2023 12:51 PM

## 2023-03-28 NOTE — Patient Instructions (Signed)
Taught [name] about self breast awareness and gave educational materials to take home. Patient did not need a Pap smear today due to last Pap smear was in 2021 per patient. Let her know [facility] will cover Pap smears every 5 years unless has a history of abnormal Pap smears. Referred patient to the [facility] for screening mammogram. Appointment scheduled for 03/28/23. Patient aware of appointment and will be there. Let patient know will follow up with her within the next couple weeks with results. Andrea Wise verbalized understanding.  [name], NP 12:53 PM

## 2023-05-29 ENCOUNTER — Other Ambulatory Visit: Payer: Self-pay

## 2023-11-18 ENCOUNTER — Telehealth: Payer: Self-pay

## 2023-11-18 NOTE — Telephone Encounter (Signed)
Telephoned patient using language line interpreter#[id number]. Left a voice message with [facility] contact information.

## 2024-01-03 ENCOUNTER — Emergency Department (HOSPITAL_COMMUNITY): Payer: Self-pay

## 2024-01-03 ENCOUNTER — Emergency Department (HOSPITAL_COMMUNITY)
Admission: EM | Admit: 2024-01-03 | Discharge: 2024-01-04 | Disposition: A | Discharge status: Home / Self Care | Payer: Self-pay | Attending: Emergency Medicine | Admitting: Emergency Medicine

## 2024-01-03 DIAGNOSIS — J069 Acute upper respiratory infection, unspecified: Secondary | ICD-10-CM | POA: Insufficient documentation

## 2024-01-03 DIAGNOSIS — E119 Type 2 diabetes mellitus without complications: Secondary | ICD-10-CM | POA: Insufficient documentation

## 2024-01-03 DIAGNOSIS — Z20822 Contact with and (suspected) exposure to covid-19: Secondary | ICD-10-CM | POA: Insufficient documentation

## 2024-01-03 LAB — CBC WITH DIFFERENTIAL/PLATELET
Abs Immature Granulocytes: 0.02 10*3/uL (ref 0.00–0.07)
Basophils Absolute: 0 10*3/uL (ref 0.0–0.1)
Basophils Relative: 1 %
Eosinophils Absolute: 0.1 10*3/uL (ref 0.0–0.5)
Eosinophils Relative: 2 %
HCT: 40 % (ref 36.0–46.0)
Hemoglobin: 13.3 g/dL (ref 12.0–15.0)
Immature Granulocytes: 0 %
Lymphocytes Relative: 20 %
Lymphs Abs: 1.2 10*3/uL (ref 0.7–4.0)
MCH: 27 pg (ref 26.0–34.0)
MCHC: 33.3 g/dL (ref 30.0–36.0)
MCV: 81.3 fL (ref 80.0–100.0)
Monocytes Absolute: 0.4 10*3/uL (ref 0.1–1.0)
Monocytes Relative: 6 %
Neutro Abs: 4.3 10*3/uL (ref 1.7–7.7)
Neutrophils Relative %: 71 %
Platelets: 258 10*3/uL (ref 150–400)
RBC: 4.92 MIL/uL (ref 3.87–5.11)
RDW: 13.8 % (ref 11.5–15.5)
WBC: 6.1 10*3/uL (ref 4.0–10.5)
nRBC: 0 % (ref 0.0–0.2)

## 2024-01-03 LAB — COMPREHENSIVE METABOLIC PANEL
ALT: 20 U/L (ref 0–44)
AST: 18 U/L (ref 15–41)
Albumin: 4 g/dL (ref 3.5–5.0)
Alkaline Phosphatase: 82 U/L (ref 38–126)
Anion gap: 14 (ref 5–15)
BUN: 11 mg/dL (ref 6–20)
CO2: 16 mmol/L — ABNORMAL LOW (ref 22–32)
Calcium: 9.3 mg/dL (ref 8.9–10.3)
Chloride: 105 mmol/L (ref 98–111)
Creatinine, Ser: 0.58 mg/dL (ref 0.44–1.00)
GFR, Estimated: >60 mL/min (ref >60)
Glucose, Bld: 187 mg/dL — ABNORMAL HIGH (ref 70–99)
Potassium: 3.4 mmol/L — ABNORMAL LOW (ref 3.5–5.1)
Sodium: 135 mmol/L (ref 135–145)
Total Bilirubin: 0.6 mg/dL (ref 0.0–1.2)
Total Protein: 7.5 g/dL (ref 6.5–8.1)

## 2024-01-03 LAB — RESP PANEL BY RT-PCR (RSV, FLU A&B, COVID)  RVPGX2
Influenza A by PCR: NEGATIVE
Influenza B by PCR: NEGATIVE
Resp Syncytial Virus by PCR: NEGATIVE
SARS Coronavirus 2 by RT PCR: NEGATIVE

## 2024-01-03 LAB — TROPONIN I (HIGH SENSITIVITY): Troponin I (High Sensitivity): <2 ng/L (ref <18)

## 2024-01-03 LAB — HCG, SERUM, QUALITATIVE: Preg, Serum: NEGATIVE

## 2024-01-03 NOTE — ED Provider Triage Note (Addendum)
Emergency Medicine Provider Triage Evaluation Note  Andrea Wise , a 43 y.o. female  was evaluated in triage.  Pt complains of cough x days. No fever. Some cp, sob  Review of Systems  Positive: Cough, cp, sob Negative:   Physical Exam  There were no vitals taken for this visit. Gen:   Awake, no distress   Resp:  Normal effort  MSK:   Moves extremities without difficulty  Other:    Medical Decision Making  Medically screening exam initiated at 7:41 PM.  Appropriate orders placed.  Vegas Coffin was informed that the remainder of the evaluation will be completed by another provider, this initial triage assessment does not replace that evaluation, and the importance of remaining in the ED until their evaluation is complete.  Cough, cp       Maurita Havener A, PA-C 01/03/24 1949

## 2024-01-03 NOTE — ED Triage Notes (Signed)
BIB EMS for cough x3 days/ denies fever/ ambulatory/ A&Ox4

## 2024-01-04 MED ORDER — BENZONATATE 100 MG PO CAPS: 100.0000 mg | ORAL_CAPSULE | Freq: Three times a day (TID) | ORAL | 0 refills | Status: AC

## 2024-01-04 MED ORDER — BENZONATATE 100 MG PO CAPS: 100.0000 mg | ORAL_CAPSULE | Freq: Three times a day (TID) | ORAL | 0 refills | Status: DC

## 2024-01-04 NOTE — Discharge Instructions (Addendum)
Lo ms probable es que tenga un virus que le est causando la tos. Le he enviado una receta de benzonatato a su farmacia para que la tome cuando la necesite para la tos. Tambin puede beber t tibio con miel para Conservation officer, nature. Alterne entre iburpofeno y Tylenol cada 4 horas segn sea necesario para los dolores corporales.  Solicite ayuda de inmediato si: Tose y [employer]. Tiene dificultad para respirar. Su corazn late muy rpidamente.  Vuelva a consultar con su mdico de cabecera la prxima semana para que reevale sus sntomas.

## 2024-01-04 NOTE — ED Provider Notes (Signed)
[facility] EMERGENCY DEPARTMENT AT [facility] Provider Note   CSN: [id number] Arrival date & time: 01/03/24  1937     History  Chief Complaint  Patient presents with   Cough    Andrea Wise is a 43 y.o. female with a history of diabetes mellitus who presents the ED today for cough.  Patient reports cough for the past 7 days.  She also endorses subjective fevers.  She has not taken anything for symptoms at home.  Denies any known sick contact.  No history of asthma, COPD, or smoking.  Denies nausea, vomiting, or diarrhea.  No additional complaints or concerns at this time.    Home Medications Prior to Admission medications   Medication Sig Start Date End Date Taking? Authorizing Provider  benzonatate (TESSALON) 100 MG capsule Take 1 capsule (100 mg total) by mouth every 8 (eight) hours for 14 days. 01/04/24 01/18/24  Maxwell Marion, PA-C  ibuprofen (ADVIL) 600 MG tablet Take 1 tablet (600 mg total) by mouth every 6 (six) hours as needed. 06/21/21   Anders Simmonds, PA-C  meloxicam (MOBIC) 15 MG tablet Take 1 tablet (15 mg total) by mouth daily. Take 1 daily with food. Patient not taking: Reported on 03/28/2023 05/08/21   Arthor Captain, PA-C  Multiple Vitamins-Minerals (HAIR SKIN AND NAILS FORMULA PO) Take 1 tablet by mouth daily.    [provider]  omeprazole (PRILOSEC) 20 MG capsule Take 1 capsule (20 mg total) by mouth daily. Patient not taking: Reported on 03/16/2021 02/12/21   Horton, Mayer Masker, MD      Allergies    Patient has no known allergies.    Review of Systems   Review of Systems  Respiratory:  Positive for cough.   All other systems reviewed and are negative.   Physical Exam Updated Vital Signs BP 129/79 (BP Location: Left Arm)   Pulse 80   Temp 98.4 F (36.9 C) (Oral)   Resp 19   SpO2 100%  Physical Exam Vitals and nursing note reviewed.  Constitutional:      Appearance: Normal appearance.  HENT:     Head: Normocephalic  and atraumatic.     Mouth/Throat:     Mouth: Mucous membranes are moist.  Eyes:     Conjunctiva/sclera: Conjunctivae normal.     Pupils: Pupils are equal, round, and reactive to light.  Cardiovascular:     Rate and Rhythm: Normal rate and regular rhythm.     Pulses: Normal pulses.     Heart sounds: Normal heart sounds.  Pulmonary:     Effort: Pulmonary effort is normal.     Breath sounds: Normal breath sounds.  Abdominal:     Palpations: Abdomen is soft.     Tenderness: There is no abdominal tenderness.  Musculoskeletal:        General: Normal range of motion.     Cervical back: Normal range of motion.  Skin:    General: Skin is warm and dry.     Findings: No rash.  Neurological:     General: No focal deficit present.     Mental Status: She is alert.  Psychiatric:        Mood and Affect: Mood normal.        Behavior: Behavior normal.    ED Results / Procedures / Treatments   Labs (all labs ordered are listed, but only abnormal results are displayed) Labs Reviewed  COMPREHENSIVE METABOLIC PANEL - Abnormal; Notable for the following components:  Result Value   Potassium 3.4 (*)    CO2 16 (*)    Glucose, Bld 187 (*)    All other components within normal limits  RESP PANEL BY RT-PCR (RSV, FLU A&B, COVID)  RVPGX2  CBC WITH DIFFERENTIAL/PLATELET  HCG, SERUM, QUALITATIVE  TROPONIN I (HIGH SENSITIVITY)    EKG EKG Interpretation Date/Time:  Friday January 03 2024 19:56:30 EST Ventricular Rate:  82 PR Interval:  177 QRS Duration:  83 QT Interval:  363 QTC Calculation: 424 R Axis:   82  Text Interpretation: Sinus rhythm Confirmed by Alvester Chou [phone]) on 01/04/2024 7:36:54 AM  Radiology DG Chest 2 View Result Date: 01/03/2024 CLINICAL DATA:  Cough for 3 days.  No fever. EXAM: CHEST - 2 VIEW COMPARISON:  12/14/2021 FINDINGS: The heart size and mediastinal contours are within normal limits. Shallow inspiration. Both lungs are clear. The visualized skeletal  structures are unremarkable. IMPRESSION: No active cardiopulmonary disease. Electronically Signed   By: Signa Kell M.D.   On: 01/03/2024 20:39    Procedures Procedures: not indicated.   Medications Ordered in ED Medications - No data to display  ED Course/ Medical Decision Making/ A&P                                 Medical Decision Making  This patient presents to the ED for concern of cough, this involves an extensive number of treatment options, and is a complaint that carries with it a high risk of complications and morbidity.   Differential diagnosis includes: Flu, COVID, RSV, pneumonia, bronchitis, other respiratory virus, etc.   Comorbidities  See HPI above   Additional History  Additional history obtained from prior records.   Lab Tests  I ordered and personally interpreted labs.  The pertinent results include:   Negative respiratory panel CMP, CBC within normal limits Troponin less than 2 Negative pregnancy   Imaging Studies  I ordered imaging studies including CXR  I independently visualized and interpreted imaging which showed: No acute cardiopulmonary disease I agree with the radiologist interpretation   Problem List / ED Course / Critical Interventions / Medication Management  Cough for the past week I have reviewed the patients home medicines and have made adjustments as needed Discussed findings with patient.  All questions were answered.   Social Determinants of Health  Language barrier   Test / Admission - Considered  Patient is stable and safe for discharge home. Return precautions provided.       Final Clinical Impression(s) / ED Diagnoses Final diagnoses:  Viral URI with cough    Rx / DC Orders ED Discharge Orders          Ordered    benzonatate (TESSALON) 100 MG capsule  Every 8 hours,   Status:  Discontinued        01/04/24 0744    benzonatate (TESSALON) 100 MG capsule  Every 8 hours        01/04/24 0745               Maxwell Marion, PA-C 01/04/24 9562    Terald Sleeper, MD 01/04/24 [phone]

## 2024-01-30 ENCOUNTER — Telehealth: Payer: Self-pay

## 2024-01-30 NOTE — Telephone Encounter (Signed)
 Telephoned patient using interpreter#[id number]. Person answered and stated "stop calling". [facility]
